# Patient Record
Sex: Male | Born: 1955 | Race: Black or African American | Hispanic: No | Marital: Married | Smoking: Never smoker
Health system: Southern US, Community
[De-identification: ages and names within clinical notes are randomized; demographics above are authoritative.]

## PROBLEM LIST (undated history)

## (undated) DIAGNOSIS — C801 Malignant (primary) neoplasm, unspecified: Secondary | ICD-10-CM

## (undated) DIAGNOSIS — R519 Headache, unspecified: Secondary | ICD-10-CM

## (undated) DIAGNOSIS — R51 Headache: Secondary | ICD-10-CM

## (undated) DIAGNOSIS — R011 Cardiac murmur, unspecified: Secondary | ICD-10-CM

## (undated) DIAGNOSIS — K4020 Bilateral inguinal hernia, without obstruction or gangrene, not specified as recurrent: Secondary | ICD-10-CM

## (undated) DIAGNOSIS — M1990 Unspecified osteoarthritis, unspecified site: Secondary | ICD-10-CM

## (undated) DIAGNOSIS — E785 Hyperlipidemia, unspecified: Secondary | ICD-10-CM

## (undated) DIAGNOSIS — I10 Essential (primary) hypertension: Secondary | ICD-10-CM

## (undated) DIAGNOSIS — R972 Elevated prostate specific antigen [PSA]: Secondary | ICD-10-CM

## (undated) HISTORY — DX: Essential (primary) hypertension: I10

## (undated) HISTORY — PX: HERNIA REPAIR: SHX51

## (undated) HISTORY — PX: PROSTATE BIOPSY: SHX241

## (undated) HISTORY — PX: COLONOSCOPY: SHX174

## (undated) HISTORY — DX: Headache: R51

## (undated) HISTORY — DX: Headache, unspecified: R51.9

## (undated) HISTORY — DX: Hyperlipidemia, unspecified: E78.5

---

## 2007-06-23 ENCOUNTER — Ambulatory Visit: Payer: Self-pay | Admitting: Unknown Physician Specialty

## 2007-07-08 ENCOUNTER — Ambulatory Visit: Payer: Self-pay | Admitting: Unknown Physician Specialty

## 2008-04-06 ENCOUNTER — Ambulatory Visit: Payer: Self-pay | Admitting: Oncology

## 2008-04-10 ENCOUNTER — Ambulatory Visit: Payer: Self-pay | Admitting: Oncology

## 2008-05-11 ENCOUNTER — Ambulatory Visit: Payer: Self-pay | Admitting: Oncology

## 2008-07-11 ENCOUNTER — Ambulatory Visit: Payer: Self-pay | Admitting: Oncology

## 2008-07-13 ENCOUNTER — Ambulatory Visit: Payer: Self-pay | Admitting: Oncology

## 2008-08-10 ENCOUNTER — Ambulatory Visit: Payer: Self-pay | Admitting: Oncology

## 2016-08-15 DIAGNOSIS — I10 Essential (primary) hypertension: Secondary | ICD-10-CM | POA: Insufficient documentation

## 2016-08-15 DIAGNOSIS — E782 Mixed hyperlipidemia: Secondary | ICD-10-CM | POA: Insufficient documentation

## 2016-12-04 ENCOUNTER — Encounter: Payer: Self-pay | Admitting: Urology

## 2016-12-04 ENCOUNTER — Ambulatory Visit: Payer: BC Managed Care – PPO | Admitting: Urology

## 2016-12-04 VITALS — BP 138/88 | HR 71 | Ht 71.0 in | Wt 199.6 lb

## 2016-12-04 DIAGNOSIS — R972 Elevated prostate specific antigen [PSA]: Secondary | ICD-10-CM

## 2016-12-04 LAB — URINALYSIS, COMPLETE
Bilirubin, UA: NEGATIVE
Glucose, UA: NEGATIVE
Ketones, UA: NEGATIVE
LEUKOCYTES UA: NEGATIVE
NITRITE UA: NEGATIVE
PH UA: 7 (ref 5.0–7.5)
PROTEIN UA: NEGATIVE
SPEC GRAV UA: 1.01 (ref 1.005–1.030)
Urobilinogen, Ur: 0.2 mg/dL (ref 0.2–1.0)

## 2016-12-04 NOTE — Progress Notes (Signed)
12/04/2016 10:00 AM   [name] Jul 19, 1956 [id number]  Referring provider: Danella Penton, MD [phone] [facility] [address] [facility] [place], [place] [address]  Chief Complaint  Patient presents with  . Elevated PSA    HPI: 61 year old [place] manis seen today for further evaluation and management of an elevated PSA.  The patient was found to have an elevated PSA as part of a routine screening by his primary care doctor. The patient denies any progression of his voiding symptoms over the past 6 months. In fact, he states that his voiding symptoms have improved over the past several years. He does complain of weak stream but feels that he empties his bladder completely. He gets up once at night. He does not have any significant trouble with erectile dysfunction. Denies any dysuria or gross hematuria. He has no history of recurrent urinary tract infections or prostatitis.  The patient has no family history of prostate cancer.  PSA history: 11/14/16: 5.90 08/06/16: 4.99 08/02/15: 3.78 07/28/14: 3.85     PMH: Past Medical History:  Diagnosis Date  . Headache   . Hyperlipidemia   . Hypertension     Surgical History: Past Surgical History:  Procedure Laterality Date  . HERNIA REPAIR      Home Medications:  Allergies as of 12/04/2016   No Known Allergies     Medication List       Accurate as of 12/04/16 10:00 AM. Always use your most recent med list.          aspirin EC 81 MG tablet Take 81 mg by mouth daily.   losartan 100 MG tablet Commonly known as:  COZAAR Take by mouth.   MULTI-VITAMINS Tabs Take by mouth.   simvastatin 40 MG tablet Commonly known as:  ZOCOR Take by mouth.   triamterene-hydrochlorothiazide 37.5-25 MG tablet Commonly known as:  MAXZIDE-25 Take by mouth.       Allergies: No Known Allergies  Family History: Family History  Problem Relation Age of Onset  . Bladder Cancer Neg Hx   . Kidney cancer Neg Hx   .  Prostate cancer Neg Hx     Social History:  reports that he has never smoked. He has never used smokeless tobacco. He reports that he drinks alcohol. He reports that he does not use drugs.  ROS: UROLOGY Frequent Urination?: Yes Hard to postpone urination?: No Burning/pain with urination?: No Get up at night to urinate?: Yes Leakage of urine?: No Urine stream starts and stops?: No Trouble starting stream?: No Do you have to strain to urinate?: No Blood in urine?: No Urinary tract infection?: No Sexually transmitted disease?: No Injury to kidneys or bladder?: No Painful intercourse?: No Weak stream?: Yes Erection problems?: No Penile pain?: No  Gastrointestinal Nausea?: No Vomiting?: No Indigestion/heartburn?: No Diarrhea?: No Constipation?: No  Constitutional Fever: No Night sweats?: No Weight loss?: No Fatigue?: No  Skin Skin rash/lesions?: No Itching?: No  Eyes Blurred vision?: No Double vision?: No  Ears/Nose/Throat Sore throat?: No Sinus problems?: No  Hematologic/Lymphatic Swollen glands?: No Easy bruising?: No  Cardiovascular Leg swelling?: No Chest pain?: No  Respiratory Cough?: No Shortness of breath?: No  Endocrine Excessive thirst?: No  Musculoskeletal Back pain?: No Joint pain?: No  Neurological Headaches?: No Dizziness?: No  Psychologic Depression?: No Anxiety?: No  Physical Exam: BP 138/88 (BP Location: Left Arm, Patient Position: Sitting, Cuff Size: Normal)   Pulse 71   Ht 5\' 11"  (1.803 m)   Wt 90.5  kg (199 lb 9.6 oz)   BMI 27.84 kg/m   Constitutional:  Alert and oriented, No acute distress. HEENT: [place] AT, moist mucus membranes.  Trachea midline, no masses. Cardiovascular: No clubbing, cyanosis, or edema. Respiratory: Normal respiratory effort, no increased work of breathing. GI: Abdomen is soft, nontender, nondistended, no abdominal masses GU: No CVA tenderness. DRE: Smooth, symmetric, + 3 Skin: No rashes, bruises  or suspicious lesions. Lymph: No cervical or inguinal adenopathy. Neurologic: Grossly intact, no focal deficits, moving all 4 extremities. Psychiatric: Normal mood and affect.  Laboratory Data: No results found for: WBC, HGB, HCT, MCV, PLT  No results found for: CREATININE  No results found for: PSA  No results found for: TESTOSTERONE  No results found for: HGBA1C  Urinalysis No results found for: COLORURINE, APPEARANCEUR, LABSPEC, PHURINE, GLUCOSEU, HGBUR, BILIRUBINUR, KETONESUR, PROTEINUR, UROBILINOGEN, NITRITE, LEUKOCYTESUR  Pertinent Imaging: none  Assessment & Plan:  The patient has an elevated PSA, he has otherwise no significant risk factors. However, he does not have any voiding symptoms or evidence of infection. His UA today demonstrates a healthy urinary tract without hematuria or inflammation.  1. Elevated PSA I spoke to the patient in detail about an elevated PSA in the differential diagnosis associated with it. We also discussed the pitfalls of PSA screening and that it is highly sensitive but nonspecific for prostate cancer. I also went over his risk factors for prostate cancer and told him that his PSAtrend it was concerning. However, I'll subsequently on the typically prostate cancer is a slowly progressive cancer in most men and that if we wanted to establish a more accurate and longer trend it would be reasonable to repeat his PSA in 3 months with close follow-up. We also briefly discussed prostate biopsy and I explained to him that this was the way to truly diagnose a prostate cancer. We also discussed the risks of biopsy including infection requiring additional antibiotics or hospitalization. After our discussion the patient and his wife have opted to proceed with repeat PSA in 3 months followed by an office visit.  - Urinalysis, Complete - PSA, total and free; Future   Return in about 3 months (around 03/06/2017) for w/ PSA prior.  Crist Fat,  MD  [facility] [address], [address] [place], [place] [address] [phone]

## 2017-03-15 ENCOUNTER — Other Ambulatory Visit: Payer: BC Managed Care – PPO

## 2017-03-15 DIAGNOSIS — R972 Elevated prostate specific antigen [PSA]: Secondary | ICD-10-CM

## 2017-03-16 LAB — PSA, TOTAL AND FREE
PSA FREE: 1.09 ng/mL
PSA, Free Pct: 20.6 %
Prostate Specific Ag, Serum: 5.3 ng/mL — ABNORMAL HIGH (ref 0.0–4.0)

## 2017-03-18 ENCOUNTER — Ambulatory Visit: Payer: BC Managed Care – PPO | Admitting: Urology

## 2017-03-18 ENCOUNTER — Encounter: Payer: Self-pay | Admitting: Urology

## 2017-03-18 VITALS — BP 148/97 | HR 82 | Ht 71.0 in | Wt 192.0 lb

## 2017-03-18 DIAGNOSIS — R972 Elevated prostate specific antigen [PSA]: Secondary | ICD-10-CM

## 2017-03-18 DIAGNOSIS — N400 Enlarged prostate without lower urinary tract symptoms: Secondary | ICD-10-CM | POA: Insufficient documentation

## 2017-03-18 NOTE — Progress Notes (Signed)
03/18/2017 11:17 AM   Glenn Burns [birth year] [id number]  Referring provider: Danella Penton, MD [phone] [facility] [address] [facility] [place], [place] [address]  Chief Complaint  Patient presents with  . Elevated PSA    82month    HPI: 61 year old Glenn Burns manis seen today for further evaluation and management of an elevated PSA.  The patient was found to have an elevated PSA as part of a routine screening by his primary care doctor. The patient denies any progression of his voiding symptoms over the past 6 months. In fact, he states that his voiding symptoms have improved over the past several years. He does complain of weak stream but feels that he empties his bladder completely. He gets up once at night. He does not have any significant trouble with erectile dysfunction. Denies any dysuria or gross hematuria. He has no history of recurrent urinary tract infections or prostatitis.  The patient has no family history of prostate cancer.  PSA history: 03/15/17: 5.3 (20% free) 11/14/16: 5.90 08/06/16: 4.99 08/02/15: 3.78 07/28/14: 3.85    Interval: The patient presents today for follow-up of elevated PSA. He denies any progression of his urinary tract symptoms. He denies any gross hematuria dysuria. He denies any new bone or back pain.  PMH: Past Medical History:  Diagnosis Date  . Headache   . Hyperlipidemia   . Hypertension     Surgical History: Past Surgical History:  Procedure Laterality Date  . HERNIA REPAIR      Home Medications:  Allergies as of 03/18/2017   No Known Allergies     Medication List       Accurate as of 03/18/17 11:17 AM. Always use your most recent med list.          aspirin EC 81 MG tablet Take 81 mg by mouth daily.   losartan 100 MG tablet Commonly known as:  COZAAR Take by mouth.   MULTI-VITAMINS Tabs Take by mouth.   simvastatin 40 MG tablet Commonly known as:  ZOCOR Take by mouth.     triamterene-hydrochlorothiazide 37.5-25 MG tablet Commonly known as:  MAXZIDE-25 Take by mouth.       Allergies: No Known Allergies  Family History: Family History  Problem Relation Age of Onset  . Bladder Cancer Neg Hx   . Kidney cancer Neg Hx   . Prostate cancer Neg Hx     Social History:  reports that he has never smoked. He has never used smokeless tobacco. He reports that he drinks alcohol. He reports that he does not use drugs.  ROS: UROLOGY Frequent Urination?: No Hard to postpone urination?: No Burning/pain with urination?: No Get up at night to urinate?: Yes Leakage of urine?: No Urine stream starts and stops?: No Trouble starting stream?: No Do you have to strain to urinate?: No Blood in urine?: No Urinary tract infection?: No Sexually transmitted disease?: No Injury to kidneys or bladder?: No Painful intercourse?: No Weak stream?: Yes Erection problems?: No Penile pain?: No  Gastrointestinal Nausea?: No Vomiting?: No Indigestion/heartburn?: No Diarrhea?: No Constipation?: No  Constitutional Fever: No Night sweats?: No Weight loss?: No Fatigue?: No  Skin Skin rash/lesions?: No Itching?: No  Eyes Blurred vision?: No Double vision?: No  Ears/Nose/Throat Sore throat?: No Sinus problems?: Yes  Hematologic/Lymphatic Swollen glands?: No Easy bruising?: No  Cardiovascular Leg swelling?: No Chest pain?: No  Respiratory Cough?: No Shortness of breath?: No  Endocrine Excessive thirst?: No  Musculoskeletal Back pain?: No Joint pain?: No  Neurological Headaches?: No Dizziness?: No  Psychologic Depression?: No Anxiety?: No  Physical Exam: BP (!) 148/97   Pulse 82   Ht 5\' 11"  (1.803 m)   Wt 87.1 kg (192 lb)   BMI 26.78 kg/m   Constitutional:  Alert and oriented, No acute distress. HEENT:  AT, moist mucus membranes.  Trachea midline, no masses. DRE: 3/18: Smooth, symmetric, + 3 Neurologic: Grossly intact, no focal  deficits, moving all 4 extremities. Psychiatric: Normal mood and affect.  Laboratory Data: No results found for: WBC, HGB, HCT, MCV, PLT  No results found for: CREATININE  No results found for: PSA  No results found for: TESTOSTERONE  No results found for: HGBA1C  Urinalysis    Component Value Date/Time   APPEARANCEUR Clear 12/04/2016 0915   GLUCOSEU Negative 12/04/2016 0915   BILIRUBINUR Negative 12/04/2016 0915   PROTEINUR Negative 12/04/2016 0915   NITRITE Negative 12/04/2016 0915   LEUKOCYTESUR Negative 12/04/2016 0915    Pertinent Imaging: none  Assessment & Plan:  The patient's PSA remains elevated, although it has decreased from 3 months prior. I spoke to the patient about the significance of an elevated PSA, albeit his is going down. This is somewhat reassuring. However, it still does remain elevated, and should be continued to be followed and/or evaluated further.  1. Elevated PSA Our discussion today centered around repeating a PSA in 6 months or performing a prostate biopsy. I told him that since his PSA went down, the likelihood of him having an aggressive prostate cancer at this point is fairly low and that repeating a PSA in 6 months would not preclude curative therapy in the future. As such, we have opted to continue surveillance and to repeat a PSA in 6 months.   - Urinalysis, Complete - PSA, total and free; Future   No Follow-up on file.  Crist Fat, MD  [facility] [address], [address] [place], [place] [address] [phone]

## 2017-09-16 ENCOUNTER — Other Ambulatory Visit: Payer: Self-pay

## 2017-09-16 DIAGNOSIS — R972 Elevated prostate specific antigen [PSA]: Secondary | ICD-10-CM

## 2017-09-17 ENCOUNTER — Other Ambulatory Visit: Payer: BC Managed Care – PPO

## 2017-09-17 ENCOUNTER — Encounter: Payer: Self-pay | Admitting: Urology

## 2017-09-19 ENCOUNTER — Ambulatory Visit: Payer: BC Managed Care – PPO

## 2019-11-05 ENCOUNTER — Ambulatory Visit: Payer: Self-pay | Attending: Family

## 2019-11-05 DIAGNOSIS — Z23 Encounter for immunization: Secondary | ICD-10-CM | POA: Insufficient documentation

## 2019-11-05 NOTE — Progress Notes (Signed)
   Covid-19 Vaccination Clinic  Name:  Jaloni Davoli    MRN: [id number] DOB: [birth year]  11/05/2019  Mr. Mesina was observed post Covid-19 immunization for 15 minutes without incidence. He was provided with Vaccine Information Sheet and instruction to access the V-Safe system.   Mr. Holt was instructed to call 911 with any severe reactions post vaccine: Marland Kitchen Difficulty breathing  . Swelling of your face and throat  . A fast heartbeat  . A bad rash all over your body  . Dizziness and weakness    Immunizations Administered    Name Date Dose VIS Date Route   Moderna COVID-19 Vaccine 11/05/2019  2:13 PM 0.5 mL 08/11/2019 Intramuscular   Manufacturer: Moderna   Lot: [id number]   NDC: 01642-903-79

## 2019-12-08 ENCOUNTER — Ambulatory Visit: Payer: Self-pay | Attending: Family

## 2019-12-08 DIAGNOSIS — Z23 Encounter for immunization: Secondary | ICD-10-CM

## 2019-12-08 NOTE — Progress Notes (Signed)
   Covid-19 Vaccination Clinic  Name:  [name]    MRN: [id number] DOB: 03-17-1956  12/08/2019  [name] was observed post Covid-19 immunization for 15 minutes without incident. He was provided with Vaccine Information Sheet and instruction to access the V-Safe system.   [name] was instructed to call 911 with any severe reactions post vaccine: Marland Kitchen Difficulty breathing  . Swelling of face and throat  . A fast heartbeat  . A bad rash all over body  . Dizziness and weakness   Immunizations Administered    Name Date Dose VIS Date Route   Moderna COVID-19 Vaccine 12/08/2019 10:20 AM 0.5 mL 08/11/2019 Intramuscular   Manufacturer: Moderna   Lot: [id number]   NDC: 92493-241-99

## 2020-07-13 ENCOUNTER — Ambulatory Visit: Payer: Self-pay | Attending: Family

## 2020-07-13 DIAGNOSIS — Z23 Encounter for immunization: Secondary | ICD-10-CM

## 2020-09-19 ENCOUNTER — Emergency Department: Payer: BC Managed Care – PPO

## 2020-09-19 ENCOUNTER — Other Ambulatory Visit: Payer: Self-pay

## 2020-09-19 DIAGNOSIS — Z79899 Other long term (current) drug therapy: Secondary | ICD-10-CM | POA: Insufficient documentation

## 2020-09-19 DIAGNOSIS — Z8673 Personal history of transient ischemic attack (TIA), and cerebral infarction without residual deficits: Secondary | ICD-10-CM | POA: Insufficient documentation

## 2020-09-19 DIAGNOSIS — Z20822 Contact with and (suspected) exposure to covid-19: Secondary | ICD-10-CM | POA: Diagnosis not present

## 2020-09-19 DIAGNOSIS — Z7982 Long term (current) use of aspirin: Secondary | ICD-10-CM | POA: Diagnosis not present

## 2020-09-19 DIAGNOSIS — I10 Essential (primary) hypertension: Secondary | ICD-10-CM | POA: Diagnosis not present

## 2020-09-19 DIAGNOSIS — H532 Diplopia: Secondary | ICD-10-CM | POA: Insufficient documentation

## 2020-09-19 DIAGNOSIS — R42 Dizziness and giddiness: Secondary | ICD-10-CM | POA: Diagnosis present

## 2020-09-19 LAB — COMPREHENSIVE METABOLIC PANEL
ALT: 17 U/L (ref 0–44)
AST: 21 U/L (ref 15–41)
Albumin: 4.4 g/dL (ref 3.5–5.0)
Alkaline Phosphatase: 46 U/L (ref 38–126)
Anion gap: 11 (ref 5–15)
BUN: 21 mg/dL (ref 8–23)
CO2: 29 mmol/L (ref 22–32)
Calcium: 9.4 mg/dL (ref 8.9–10.3)
Chloride: 100 mmol/L (ref 98–111)
Creatinine, Ser: 1.01 mg/dL (ref 0.61–1.24)
GFR, Estimated: >60 mL/min (ref >60)
Glucose, Bld: 107 mg/dL — ABNORMAL HIGH (ref 70–99)
Potassium: 3.7 mmol/L (ref 3.5–5.1)
Sodium: 140 mmol/L (ref 135–145)
Total Bilirubin: 0.7 mg/dL (ref 0.3–1.2)
Total Protein: 7.6 g/dL (ref 6.5–8.1)

## 2020-09-19 LAB — CBC
HCT: 47.5 % (ref 39.0–52.0)
Hemoglobin: 15.2 g/dL (ref 13.0–17.0)
MCH: 26.8 pg (ref 26.0–34.0)
MCHC: 32 g/dL (ref 30.0–36.0)
MCV: 83.8 fL (ref 80.0–100.0)
Platelets: 216 10*3/uL (ref 150–400)
RBC: 5.67 MIL/uL (ref 4.22–5.81)
RDW: 13.5 % (ref 11.5–15.5)
WBC: 5.2 10*3/uL (ref 4.0–10.5)
nRBC: 0 % (ref 0.0–0.2)

## 2020-09-19 LAB — TROPONIN I (HIGH SENSITIVITY)
Troponin I (High Sensitivity): 5 ng/L (ref <18)
Troponin I (High Sensitivity): 5 ng/L (ref <18)

## 2020-09-19 NOTE — ED Triage Notes (Signed)
Pt states today at 1630 he had episode of double vision and dizziness that lasted about 1-2 mins. States symptoms have resolved now just has nausea.

## 2020-09-19 NOTE — ED Notes (Signed)
No code stroke per Dr. Derrill Kay

## 2020-09-19 NOTE — ED Triage Notes (Signed)
First Nurse Note:  Arrives from [facility] for ED evaluation of sudden onset blurred vision, which is now resolved, and HTN.  Patient is AAOx3.  Skin warm and dry no apparent distress

## 2020-09-19 NOTE — ED Notes (Signed)
Pt also complaining of right sided back/rib pain. Started last Saturday. When pt twist to move he says he feels a dull pain in that area. He forgot to mention in earlier triage note.

## 2020-09-20 ENCOUNTER — Emergency Department: Payer: BC Managed Care – PPO

## 2020-09-20 ENCOUNTER — Encounter: Payer: Self-pay | Admitting: Radiology

## 2020-09-20 ENCOUNTER — Emergency Department
Admission: EM | Admit: 2020-09-20 | Discharge: 2020-09-20 | Disposition: A | Discharge status: Home / Self Care | Payer: BC Managed Care – PPO | Attending: Emergency Medicine | Admitting: Emergency Medicine

## 2020-09-20 DIAGNOSIS — R42 Dizziness and giddiness: Secondary | ICD-10-CM

## 2020-09-20 DIAGNOSIS — H532 Diplopia: Secondary | ICD-10-CM

## 2020-09-20 DIAGNOSIS — I10 Essential (primary) hypertension: Secondary | ICD-10-CM

## 2020-09-20 LAB — RESP PANEL BY RT-PCR (FLU A&B, COVID) ARPGX2
Influenza A by PCR: NEGATIVE
Influenza B by PCR: NEGATIVE
SARS Coronavirus 2 by RT PCR: NEGATIVE

## 2020-09-20 LAB — D-DIMER, QUANTITATIVE: D-Dimer, Quant: <0.27 ug/mL-FEU (ref 0.00–0.50)

## 2020-09-20 MED ORDER — CLONIDINE HCL 0.1 MG PO TABS: 0.1000 mg | ORAL_TABLET | Freq: Two times a day (BID) | ORAL | 0 refills | Status: DC | PRN

## 2020-09-20 MED ORDER — IOHEXOL 350 MG/ML SOLN
75.0000 mL | Freq: Once | INTRAVENOUS | Status: AC | PRN
  Administered 2020-09-20: 75 mL via INTRAVENOUS

## 2020-09-20 MED ORDER — CLONIDINE HCL 0.1 MG PO TABS
0.1000 mg | ORAL_TABLET | Freq: Once | ORAL | Status: AC
  Administered 2020-09-20: 0.1 mg via ORAL
  Filled 2020-09-20: qty 1

## 2020-09-20 MED ORDER — MECLIZINE HCL 25 MG PO TABS: 25.0000 mg | ORAL_TABLET | Freq: Three times a day (TID) | ORAL | 0 refills | Status: DC | PRN

## 2020-09-20 NOTE — ED Notes (Signed)
Provider to bedside to update patient

## 2020-09-20 NOTE — Discharge Instructions (Addendum)
1.  You may take Clonidine up to twice daily for: Top blood pressure>200 Bottom blood pressure>100 2.  Continue to take your other blood pressure medications until you see your doctor 3.  Return to the ER for worsening symptoms, persistent vomiting, difficulty breathing or other concerns.

## 2020-09-20 NOTE — ED Provider Notes (Signed)
[facility] Emergency Department Provider Note   ____________________________________________   Event Date/Time   First MD Initiated Contact with Patient 09/20/20 0226     (approximate)  I have reviewed the triage vital signs and the nursing notes.   HISTORY  Chief Complaint Visual Field Change    HPI Glenn Burns is a 65 y.o. male sent to the ED from [facility] for evaluation of TIA.  Patient reports since September he has had multiple episodes of diplopia associated with dizziness, lasting 1 to 2 minutes.  He has been unable to make an appointment to see his eye doctor secondary to long wait.  Saw his PCP during that time who thought it may be related to elevated blood pressure.  Yesterday approximately 4:30 PM he felt double vision and dizziness coming on while [place]  He was able to pull over and experienced 1 to 2 minutes of double vision with dizziness which he describes as his car spinning.  Associated with nausea only.  Denies associated headache, neck pain, fever, cough, chest pain, shortness of breath, abdominal pain, vomiting, slurred speech, facial droop, extremity weakness/numbness/tingling.  Patient is vaccinated against COVID-19. Patient presented to [facility] for evaluation and was sent over for further imaging.  Also notes several days of right thoracic back pain worsened by movement, not affected by deep inspiration.  Feels like a pulled muscle.  Denies recent trauma, travel or hormone use.     Past Medical History:  Diagnosis Date  . Headache   . Hyperlipidemia   . Hypertension     Patient Active Problem List   Diagnosis Date Noted  . BPH (benign prostatic hyperplasia) 03/18/2017  . Benign essential hypertension 08/15/2016  . Hyperlipidemia, mixed 08/15/2016    Past Surgical History:  Procedure Laterality Date  . HERNIA REPAIR      Prior to Admission medications   Medication Sig Start  Date End Date Taking? Authorizing Provider  cloNIDine (CATAPRES) 0.1 MG tablet Take 1 tablet (0.1 mg total) by mouth 2 (two) times daily as needed. 09/20/20  Yes Irean Hong, MD  meclizine (ANTIVERT) 25 MG tablet Take 1 tablet (25 mg total) by mouth 3 (three) times daily as needed for dizziness or nausea. 09/20/20  Yes Irean Hong, MD  aspirin EC 81 MG tablet Take 81 mg by mouth daily.    [provider]  losartan (COZAAR) 100 MG tablet Take by mouth. 08/15/16   [provider]  Multiple Vitamin (MULTI-VITAMINS) TABS Take by mouth.    [provider]  simvastatin (ZOCOR) 40 MG tablet Take by mouth. 08/15/16   [provider]  triamterene-hydrochlorothiazide (MAXZIDE-25) 37.5-25 MG tablet Take by mouth. 08/15/16   [provider]    Allergies Patient has no known allergies.  Family History  Problem Relation Age of Onset  . Bladder Cancer Neg Hx   . Kidney cancer Neg Hx   . Prostate cancer Neg Hx     Social History Social History   Tobacco Use  . Smoking status: Never Smoker  . Smokeless tobacco: Never Used  Substance Use Topics  . Alcohol use: Yes  . Drug use: No    Review of Systems  Constitutional: No fever/chills Eyes: Positive for double vision.  No visual changes. ENT: No sore throat. Cardiovascular: Denies chest pain. Respiratory: Denies shortness of breath. Gastrointestinal: No abdominal pain.  No nausea, no vomiting.  No diarrhea.  No constipation. Genitourinary: Negative  for dysuria. Musculoskeletal: Negative for back pain. Skin: Negative for rash. Neurological: Positive for dizziness. Negative for headaches, focal weakness or numbness.   ____________________________________________   PHYSICAL EXAM:  VITAL SIGNS: ED Triage Vitals [09/19/20 1904]  Enc Vitals Group     BP (!) 181/123     Pulse Rate 66     Resp 20     Temp 98 F (36.7 C)     Temp Source Oral     SpO2 97 %     Weight 195 lb (88.5 kg)     Height  5\' 11"  (1.803 m)     Head Circumference      Peak Flow      Pain Score 0     Pain Loc      Pain Edu?      Excl. in GC?     Constitutional: Alert and oriented. Well appearing and in no acute distress. Eyes: Conjunctivae are normal. PERRL. EOMI. No diplopia currently. Head: Atraumatic. Nose: No congestion/rhinnorhea. Mouth/Throat: Mucous membranes are moist.   Neck: No stridor.  No carotid bruits.  Supple neck without meningismus. Cardiovascular: Normal rate, regular rhythm. Grossly normal heart sounds.  Good peripheral circulation. Respiratory: Normal respiratory effort.  No retractions. Lungs CTAB. Gastrointestinal: Soft and nontender. No distention. No abdominal bruits. No CVA tenderness. Musculoskeletal: No lower extremity tenderness nor edema.  No joint effusions. Neurologic: Alert and oriented x3.  CN II to XII grossly intact.  Normal speech and language. No gross focal neurologic deficits are appreciated. MAEx4. No gait instability. Skin:  Skin is warm, dry and intact. No rash noted. Psychiatric: Mood and affect are normal. Speech and behavior are normal.  ____________________________________________   LABS (all labs ordered are listed, but only abnormal results are displayed)  Labs Reviewed  COMPREHENSIVE METABOLIC PANEL - Abnormal; Notable for the following components:      Result Value   Glucose, Bld 107 (*)    All other components within normal limits  RESP PANEL BY RT-PCR (FLU A&B, COVID) ARPGX2  CBC  D-DIMER, QUANTITATIVE (NOT AT [facility])  TROPONIN I (HIGH SENSITIVITY)  TROPONIN I (HIGH SENSITIVITY)   ____________________________________________  EKG  ED ECG REPORT I, Kendryck Lacroix J, the attending physician, personally viewed and interpreted this ECG.   Date: 09/20/2020  EKG Time: 1926  Rate: 63  Rhythm: normal EKG, normal sinus rhythm  Axis: Normal  Intervals:none  ST&T Change: Nonspecific  ____________________________________________  RADIOLOGY I,  Maverik Foot J, personally viewed and evaluated these images (plain radiographs) as part of my medical decision making, as well as reviewing the written report by the radiologist.  ED MD interpretation: No ICH  Official radiology report(s): DG Chest 2 View  Result Date: 09/20/2020 CLINICAL DATA:  Right rib and back pain which began last Saturday, dull pain with twisting motion EXAM: CHEST - 2 VIEW COMPARISON:  None. FINDINGS: Some streaky atelectatic changes are present likely related to splinting. No consolidation, features of edema, pneumothorax, or effusion. The cardiomediastinal contours are unremarkable. No visible displaced rib fracture or acute traumatic osseous injuries of the chest wall. Soft tissues are unremarkable. Telemetry leads overlie the chest. IMPRESSION: Streaky atelectatic changes likely related to splinting in the setting of chest pain. No radiographically evident chest wall abnormality or other acute cardiopulmonary findings. Electronically Signed   By: Monday M.D.   On: 09/20/2020 02:56   CT Head Wo Contrast  Result Date: 09/19/2020 CLINICAL DATA:  Neuro deficit, acute, stroke suspected Dizziness and double lesion earlier  today. EXAM: CT HEAD WITHOUT CONTRAST TECHNIQUE: Contiguous axial images were obtained from the base of the skull through the vertex without intravenous contrast. COMPARISON:  None. FINDINGS: Brain: Brain volume is normal for age. No intracranial hemorrhage, mass effect, or midline shift. No hydrocephalus. The basilar cisterns are patent. No evidence of territorial infarct or acute ischemia. No extra-axial or intracranial fluid collection. Vascular: No hyperdense vessel or unexpected calcification. Skull: No fracture or focal lesion. Sinuses/Orbits: Paranasal sinuses and mastoid air cells are clear. The visualized orbits are unremarkable. Other: None. IMPRESSION: Negative noncontrast head CT. Electronically Signed   By: Narda Rutherford M.D.   On: 09/19/2020  19:28   CT Angio Chest PE W/Cm &/Or Wo Cm  Result Date: 09/20/2020 CLINICAL DATA:  65 year old male with chest pain, shortness of breath, recurrent syncope. EXAM: CT ANGIOGRAPHY CHEST WITH CONTRAST TECHNIQUE: Multidetector CT imaging of the chest was performed using the standard protocol during bolus administration of intravenous contrast. Multiplanar CT image reconstructions and MIPs were obtained to evaluate the vascular anatomy. CONTRAST:  63mL OMNIPAQUE IOHEXOL 350 MG/ML SOLN COMPARISON:  Chest radiographs 0242 hours today. CT Abdomen and Pelvis 07/08/2007. FINDINGS: Cardiovascular: Adequate contrast bolus timing in the pulmonary arterial tree. No focal filling defect identified in the pulmonary arteries to suggest acute pulmonary embolism. Some calcified coronary artery atherosclerosis is evident on series 5, image 136. No cardiomegaly or pericardial effusion. Negative visible aorta. Mediastinum/Nodes: Negative. Lungs/Pleura: Major airways are patent. There is mild dependent atelectasis in both lungs. No pleural effusion or other abnormal pulmonary opacity. Upper Abdomen: Negative visible liver, gallbladder, spleen, pancreas, right adrenal gland, and bowel in the upper abdomen. Musculoskeletal: No acute osseous abnormality identified. Review of the MIP images confirms the above findings. IMPRESSION: 1. Negative for acute pulmonary embolus. 2. Mild pulmonary atelectasis. 3. Calcified coronary artery atherosclerosis. Electronically Signed   By: Odessa Fleming M.D.   On: 09/20/2020 05:26   MR BRAIN WO CONTRAST  Result Date: 09/20/2020 CLINICAL DATA:  Dizziness and diplopia EXAM: MRI HEAD WITHOUT CONTRAST TECHNIQUE: Multiplanar, multiecho pulse sequences of the brain and surrounding structures were obtained without intravenous contrast. COMPARISON:  None. FINDINGS: Brain: No acute infarct, mass effect or extra-axial collection. No acute or chronic hemorrhage. There is minimal multifocal hyperintense T2-weighted  signal within the white matter. Parenchymal volume and CSF spaces are normal. The midline structures are normal. Vascular: Major flow voids are preserved. Skull and upper cervical spine: Normal calvarium and skull base. Visualized upper cervical spine and soft tissues are normal. Sinuses/Orbits:Right maxillary sinus opacification.  Normal orbits. IMPRESSION: Normal brain MRI. Electronically Signed   By: Deatra Robinson M.D.   On: 09/20/2020 03:36    ____________________________________________   PROCEDURES  Procedure(s) performed (including Critical Care):  Procedures   ____________________________________________   INITIAL IMPRESSION / ASSESSMENT AND PLAN / ED COURSE  As part of my medical decision making, I reviewed the following data within the electronic MEDICAL RECORD NUMBER Nursing notes reviewed and incorporated, Labs reviewed, EKG interpreted, Old chart reviewed, Radiograph reviewed and Notes from prior ED visits     65 year old male presenting with several episodes of diplopia associated with dizziness/vertigo x4 to 5 months.  Differential diagnosis includes but is not limited to CVA, TIA, ACS, metabolic, infectious etiologies, etc.  CT head, cardiac panel unremarkable.  Will obtain MRI of the brain, check chest x-ray, D-dimer, respiratory panel.  Will reassess.  Clinical Course as of 09/20/20 0658  Tue Sep 20, 2020  0402 Patient resting in no  acute distress.  Updated him on MRI result.  Unfortunately, D-dimer is a send out currently.  Will obtain CTA chest to evaluate for PE.  Personally reviewed patient's old chart and see he had a office visit 07/2020 where his PCP changed his antihypertensive from losartan to olmesartan 40 mg.  He continued Maxide 37.5/25 mg [JS]  0540 Updated patient on negative CTA chest results.  Respiratory panel also negative.  Will refer patient to ophthalmology and neurology for outpatient follow-up.  He will follow-up with his PCP to address blood pressure.   Will prescribe limited quantity clonidine to bridge him until his appointment.  Strict return precautions given.  Patient verbalizes understanding and agrees with plan of care [JS]    Clinical Course User Index [JS] Irean Hong, MD     ____________________________________________   FINAL CLINICAL IMPRESSION(S) / ED DIAGNOSES  Final diagnoses:  Primary hypertension  Dizziness  Diplopia     ED Discharge Orders         Ordered    cloNIDine (CATAPRES) 0.1 MG tablet  2 times daily PRN        09/20/20 0541    meclizine (ANTIVERT) 25 MG tablet  3 times daily PRN        09/20/20 0556          *Please note:  Glenn Burns was evaluated in Emergency Department on 09/20/2020 for the symptoms described in the history of present illness. He was evaluated in the context of the global COVID-19 pandemic, which necessitated consideration that the patient might be at risk for infection with the SARS-CoV-2 virus that causes COVID-19. Institutional protocols and algorithms that pertain to the evaluation of patients at risk for COVID-19 are in a state of rapid change based on information released by regulatory bodies including the CDC and federal and state organizations. These policies and algorithms were followed during the patient's care in the ED.  Some ED evaluations and interventions may be delayed as a result of limited staffing during and the pandemic.*   Note:  This document was prepared using Dragon voice recognition software and may include unintentional dictation errors.   Irean Hong, MD 09/20/20 [phone]

## 2020-09-20 NOTE — ED Notes (Signed)
Pt transported to MRI 

## 2020-09-24 NOTE — Progress Notes (Signed)
   Covid-19 Vaccination Clinic  Name:  Trinton Prewitt    MRN: [id number] DOB: [birth year]  09/24/2020  Mr. Osias was observed post Covid-19 immunization for 15 minutes without incident. He was provided with Vaccine Information Sheet and instruction to access the V-Safe system.   Mr. Mcadory was instructed to call 911 with any severe reactions post vaccine: Marland Kitchen Difficulty breathing  . Swelling of face and throat  . A fast heartbeat  . A bad rash all over body  . Dizziness and weakness   Immunizations Administered    Name Date Dose VIS Date Route   Moderna COVID-19 Vaccine 07/13/2020 -- -- --   Lot: [id number]   Moderna Covid-19 Booster Vaccine 07/13/2020 10:40 AM 0.25 mL 06/29/2020 Intramuscular   Manufacturer: Moderna   Lot: [id number]   NDC: 76151-834-37

## 2021-06-27 ENCOUNTER — Ambulatory Visit: Payer: BC Managed Care – PPO | Attending: Family

## 2021-06-27 DIAGNOSIS — Z23 Encounter for immunization: Secondary | ICD-10-CM

## 2021-06-27 NOTE — Progress Notes (Signed)
   Covid-19 Vaccination Clinic  Name:  Rik Wadel    MRN: [id number] DOB: 1956-05-15  06/27/2021  Mr. Granato was observed post Covid-19 immunization for 15 minutes without incident. He was provided with Vaccine Information Sheet and instruction to access the V-Safe system.   Mr. Lepage was instructed to call 911 with any severe reactions post vaccine: Difficulty breathing  Swelling of face and throat  A fast heartbeat  A bad rash all over body  Dizziness and weakness

## 2022-05-28 IMAGING — CT CT ANGIO CHEST
2 of 6 series · 19 of 46 positions shown · IV contrast (omnipaque)
Comparison: Chest radiographs 1939 hours today. CT Abdomen and
Pelvis 07/08/2007.

CLINICAL DATA: 64-year-old male with chest pain, shortness of
breath, recurrent syncope.

EXAM:
CT ANGIOGRAPHY CHEST WITH CONTRAST
TECHNIQUE: Multidetector CT imaging of the chest was performed using the
standard protocol during bolus administration of intravenous
contrast. Multiplanar CT image reconstructions and MIPs were
obtained to evaluate the vascular anatomy.
CONTRAST:  75mL OMNIPAQUE IOHEXOL 350 MG/ML SOLN

[Series 5: thins · axial · 0.68mm/px · z∈[-541,-286]mm · 17 of 281 slices shown]
[im 13/281  lung]
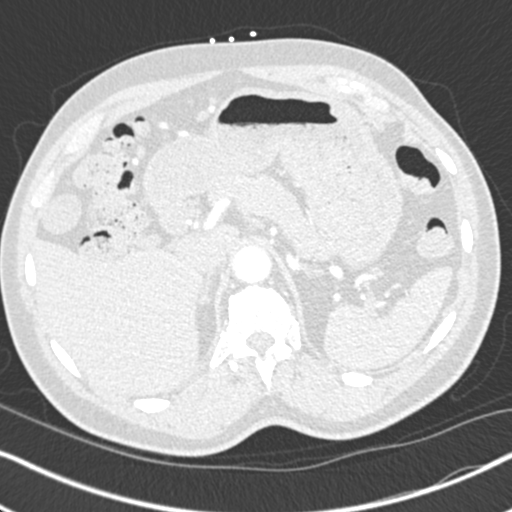
[im 25/281  soft-tissue]
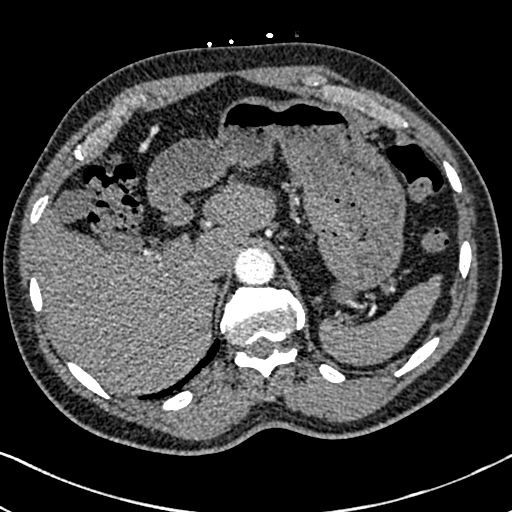
[im 49/281  lung]
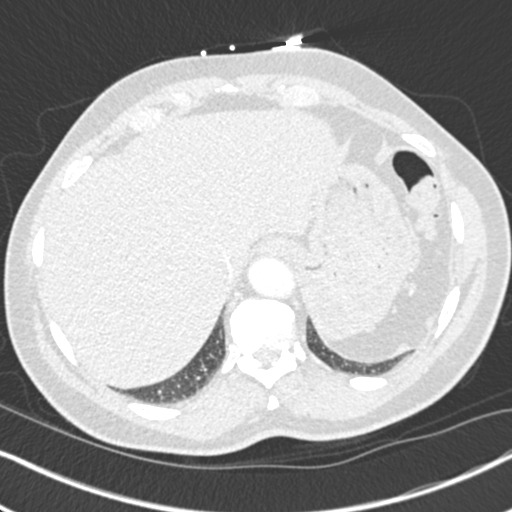
[im 61/281  soft-tissue]
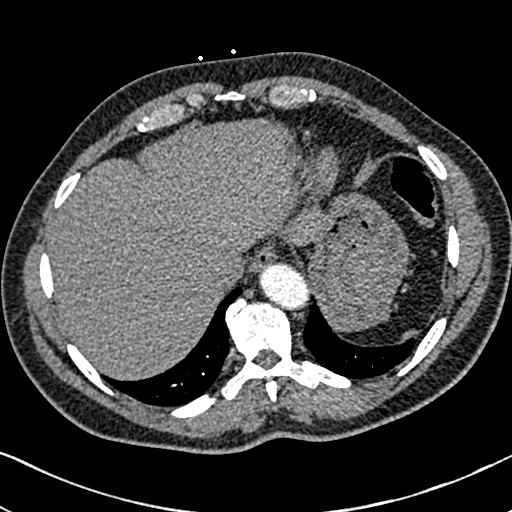
[im 74/281  lung]
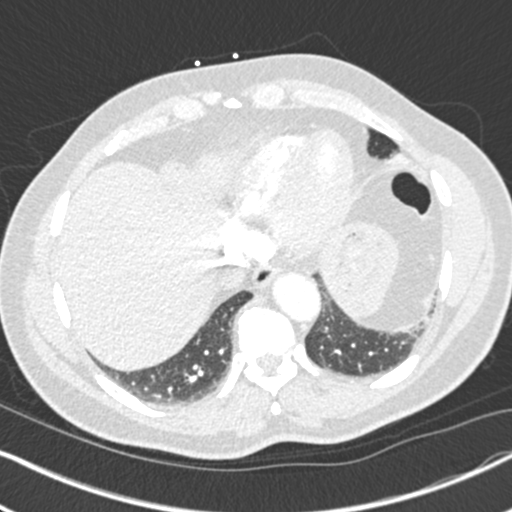
[im 98/281  soft-tissue]
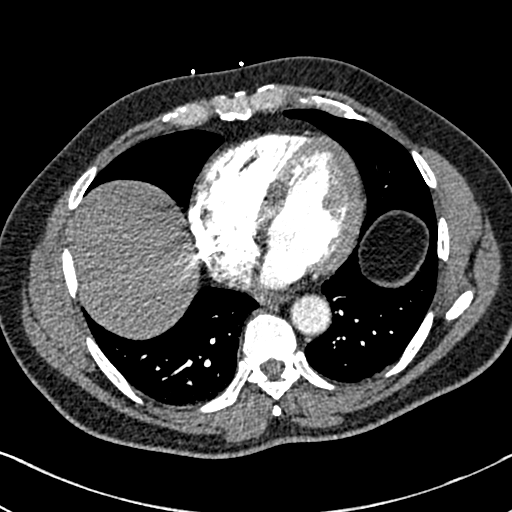
[im 110/281  lung]
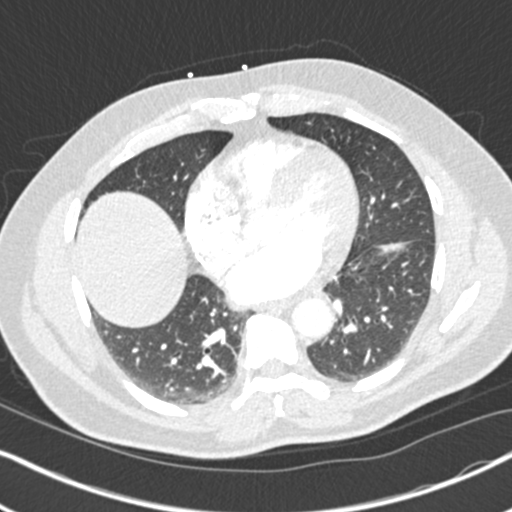
[im 122/281  soft-tissue]
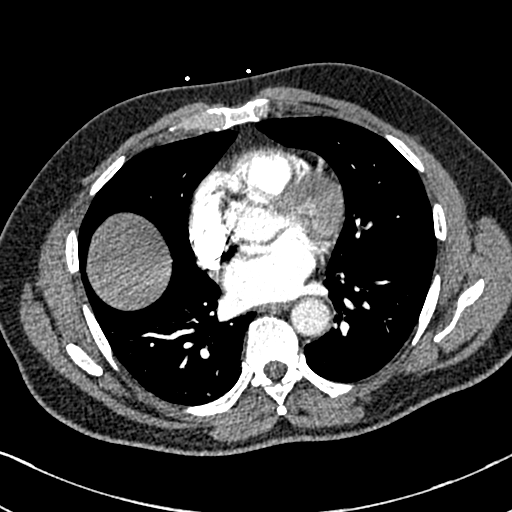
[im 147/281  lung]
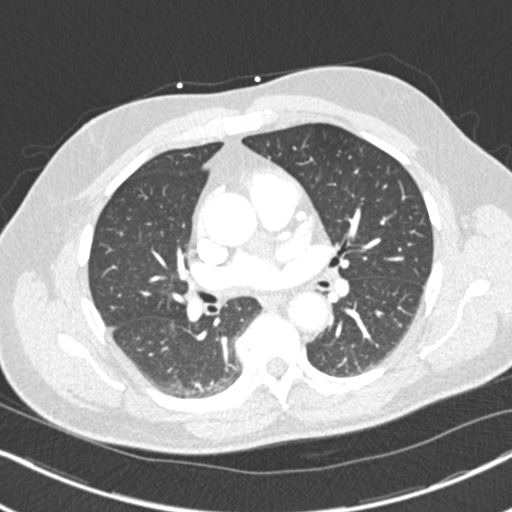
[im 159/281  soft-tissue]
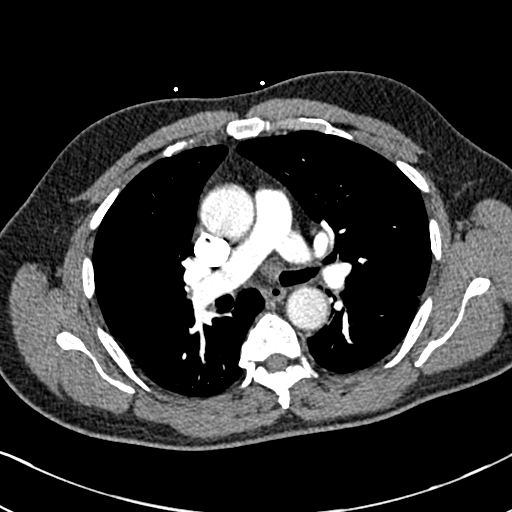
[im 171/281  lung]
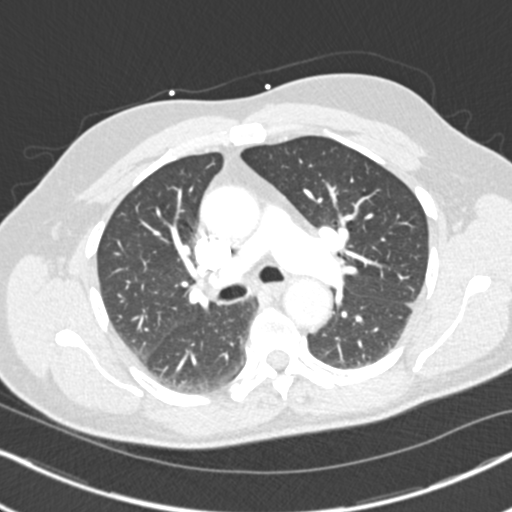
[im 183/281  soft-tissue]
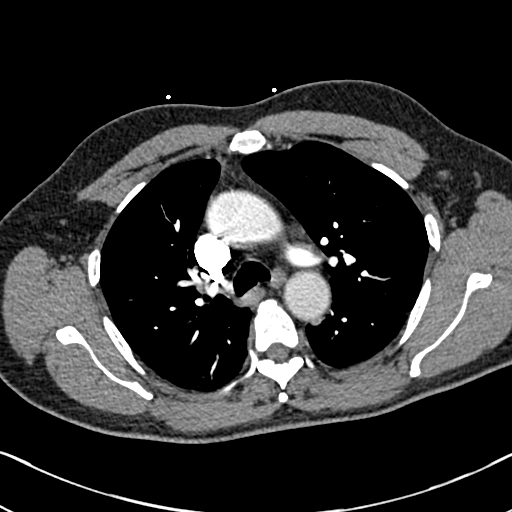
[im 207/281  lung]
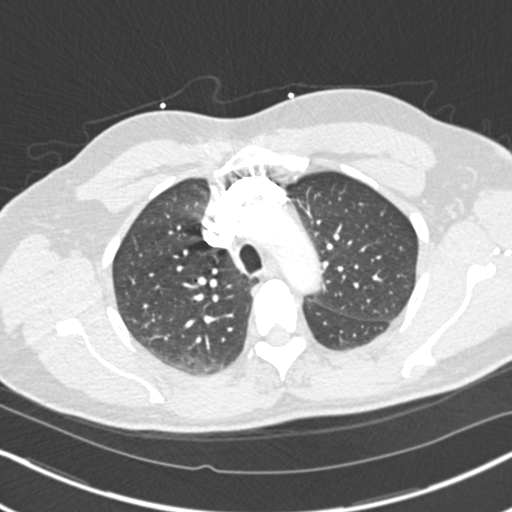
[im 220/281  soft-tissue]
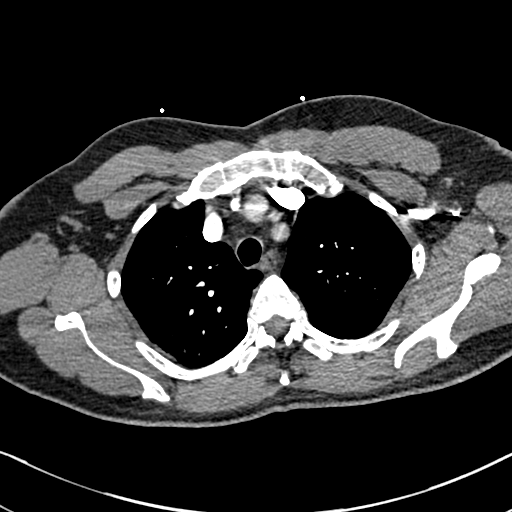
[im 232/281  lung]
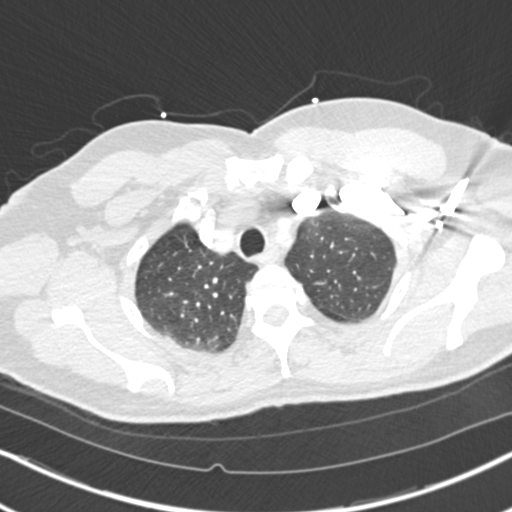
[im 256/281  soft-tissue]
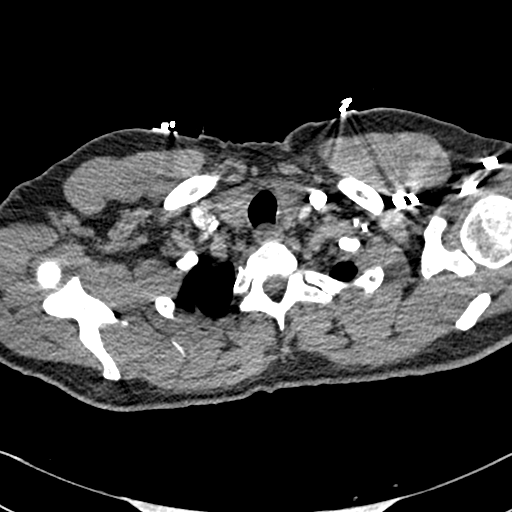
[im 268/281  lung]
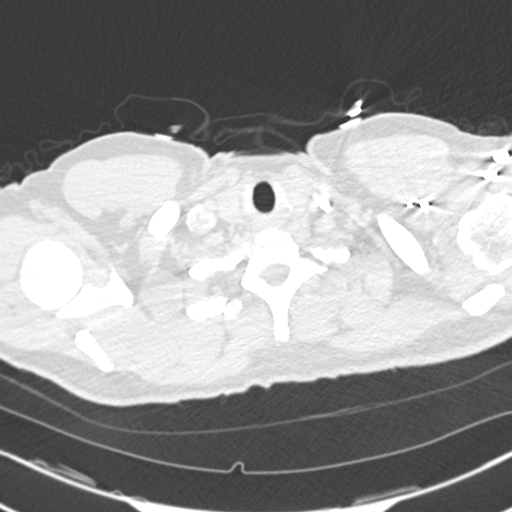

[Series 7: coronal mpr · coronal · 0.55mm/px · 2 of 94 slices shown]
[im 32/94  soft-tissue]
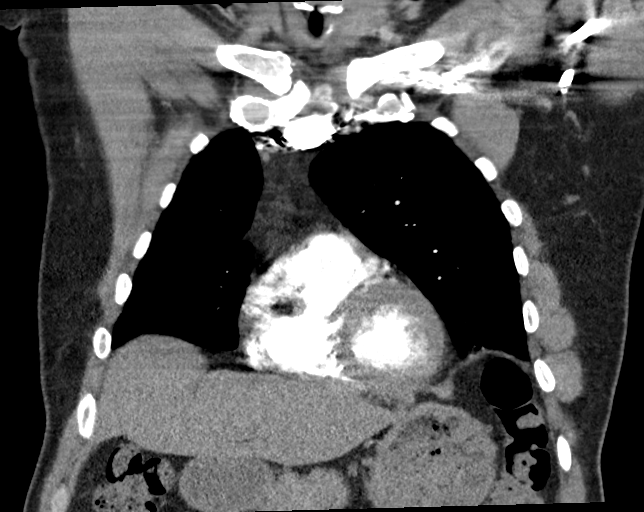
[im 63/94  soft-tissue]
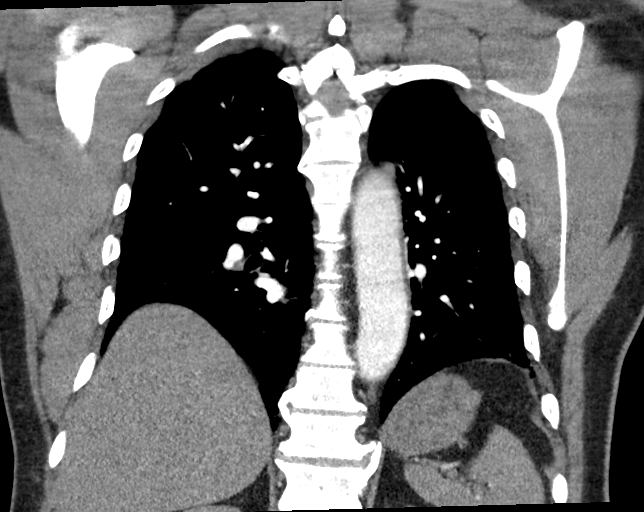

[19 of 46 positions shown; findings below may reference images not displayed]

FINDINGS: Cardiovascular: Adequate contrast bolus timing in the pulmonary
arterial tree.

No focal filling defect identified in the pulmonary arteries to
suggest acute pulmonary embolism.

Some calcified coronary artery atherosclerosis is evident on series
5, image 136. No cardiomegaly or pericardial effusion. Negative
visible aorta.

Mediastinum/Nodes: Negative.

Lungs/Pleura: Major airways are patent. There is mild dependent
atelectasis in both lungs. No pleural effusion or other abnormal
pulmonary opacity.

Upper Abdomen: Negative visible liver, gallbladder, spleen,
pancreas, right adrenal gland, and bowel in the upper abdomen.

Musculoskeletal: No acute osseous abnormality identified.

Review of the MIP images confirms the above findings.
IMPRESSION: 1. Negative for acute pulmonary embolus.
2. Mild pulmonary atelectasis.
3. Calcified coronary artery atherosclerosis.

## 2022-10-23 ENCOUNTER — Other Ambulatory Visit: Payer: Self-pay | Admitting: Internal Medicine

## 2022-10-23 DIAGNOSIS — R972 Elevated prostate specific antigen [PSA]: Secondary | ICD-10-CM

## 2022-10-27 ENCOUNTER — Ambulatory Visit
Admission: RE | Admit: 2022-10-27 | Discharge: 2022-10-27 | Disposition: A | Discharge status: Home / Self Care | Payer: BC Managed Care – PPO | Source: Ambulatory Visit | Attending: Internal Medicine | Admitting: Internal Medicine

## 2022-10-27 DIAGNOSIS — R972 Elevated prostate specific antigen [PSA]: Secondary | ICD-10-CM | POA: Diagnosis present

## 2022-10-27 MED ORDER — GADOBUTROL 1 MMOL/ML IV SOLN
7.5000 mL | Freq: Once | INTRAVENOUS | Status: AC | PRN
  Administered 2022-10-27: 7.5 mL via INTRAVENOUS

## 2022-11-30 ENCOUNTER — Other Ambulatory Visit (HOSPITAL_COMMUNITY): Payer: Self-pay | Admitting: Urology

## 2022-11-30 DIAGNOSIS — C61 Malignant neoplasm of prostate: Secondary | ICD-10-CM

## 2022-12-06 ENCOUNTER — Telehealth: Payer: Self-pay

## 2022-12-06 NOTE — Telephone Encounter (Signed)
Called and left voicemail for patient with upcoming appointment and letting him know I am sending a packet with appointment information and to call with any questions

## 2022-12-21 ENCOUNTER — Encounter (HOSPITAL_COMMUNITY)
Admission: RE | Admit: 2022-12-21 | Discharge: 2022-12-21 | Disposition: A | Discharge status: Home / Self Care | Payer: BC Managed Care – PPO | Source: Ambulatory Visit | Attending: Urology | Admitting: Urology

## 2022-12-21 DIAGNOSIS — C61 Malignant neoplasm of prostate: Secondary | ICD-10-CM

## 2022-12-21 MED ORDER — PIFLIFOLASTAT F 18 (PYLARIFY) INJECTION
9.0000 | Freq: Once | INTRAVENOUS | Status: AC
  Administered 2022-12-21: 9.7 via INTRAVENOUS

## 2022-12-31 DIAGNOSIS — C61 Malignant neoplasm of prostate: Secondary | ICD-10-CM | POA: Insufficient documentation

## 2022-12-31 NOTE — Progress Notes (Unsigned)
                               Care Plan Summary  Name: Argenis Kumari  DOB: 1955-10-20   Your Medical Team:   Urologist -  Dr. Heloise Purpura, [facility]  Radiation Oncologist - Dr. Margaretmary Dys, [facility]     Recommendations: 1) Hormonal Therapy 2) Radiation  3) Surgery      * These recommendations are based on information available as of today's consult.      Recommendations may change depending on the results of further tests or exams.    Next Steps: 1) Consider all your options.  Contact Marisue Ivan, your nurse navigator, with any questions or treatment decisions.      When appointments need to be scheduled, you will be contacted by [facility] and/or [facility].  Questions?  Please do not hesitate to call Cherlyn Cushing, BSN, RN at [phone] with any questions or concerns.  Marisue Ivan is your Oncology Nurse Navigator and is available to assist you while you're receiving your medical care at [facility].

## 2022-12-31 NOTE — Progress Notes (Signed)
RN spoke with patient and reminded him of upcoming [facility] appointment for 4/23.  Patient confirmed he did have his packet and will bring to clinic appointment tomorrow.

## 2023-01-01 ENCOUNTER — Other Ambulatory Visit: Payer: Self-pay | Admitting: Genetic Counselor

## 2023-01-01 ENCOUNTER — Inpatient Hospital Stay: Payer: BC Managed Care – PPO | Attending: Radiation Oncology | Admitting: Genetic Counselor

## 2023-01-01 ENCOUNTER — Inpatient Hospital Stay: Payer: BC Managed Care – PPO

## 2023-01-01 ENCOUNTER — Encounter: Payer: Self-pay | Admitting: Radiation Oncology

## 2023-01-01 ENCOUNTER — Ambulatory Visit
Admission: RE | Admit: 2023-01-01 | Discharge: 2023-01-01 | Disposition: A | Discharge status: Home / Self Care | Payer: BC Managed Care – PPO | Source: Ambulatory Visit | Attending: Radiation Oncology | Admitting: Radiation Oncology

## 2023-01-01 ENCOUNTER — Other Ambulatory Visit: Payer: Self-pay

## 2023-01-01 VITALS — BP 134/81 | HR 85 | Temp 97.3°F | Resp 18 | Ht 71.0 in | Wt 196.6 lb

## 2023-01-01 DIAGNOSIS — Z1379 Encounter for other screening for genetic and chromosomal anomalies: Secondary | ICD-10-CM

## 2023-01-01 DIAGNOSIS — C61 Malignant neoplasm of prostate: Secondary | ICD-10-CM

## 2023-01-01 DIAGNOSIS — Z8042 Family history of malignant neoplasm of prostate: Secondary | ICD-10-CM | POA: Diagnosis not present

## 2023-01-01 HISTORY — DX: Unspecified osteoarthritis, unspecified site: M19.90

## 2023-01-01 HISTORY — DX: Elevated prostate specific antigen (PSA): R97.20

## 2023-01-01 LAB — GENETIC SCREENING ORDER

## 2023-01-01 NOTE — Progress Notes (Signed)
Radiation Oncology         [phone] ________________________________  Multidisciplinary Prostate Cancer Clinic  Initial Radiation Oncology Consultation  Name: Glenn Burns MRN: [id number]  Date: 01/01/2023  DOB: 1956/07/02  WU:[id number], Hardin Negus, MD  Bjorn Pippin, MD   REFERRING PHYSICIAN: Bjorn Pippin, MD  DIAGNOSIS: 67 y.o. gentleman with stage T1c adenocarcinoma of the prostate with a Gleason's score of 4+4 and a PSA of 12.11    ICD-10-CM   1. Malignant neoplasm of prostate  C61       HISTORY OF PRESENT ILLNESS::Glenn Burns is a 67 y.o. gentleman.  He has a history of elevated PSA since at least 2018 when he was initially seen by Dr. Marlou Porch for PSA of 5.3.  The PSA subsequently decreased on repeat labs and the patient was lost to follow up thereafter. More recently, his PSA was further elevated at 8.14 on routine labs by his primary care physician, Dr. Hyacinth Meeker on 10/25/22. His PCP ordered a prostate MRI for further evaluation and this was performed on 10/27/22 showing a PI-RADS 5 lesion in the right mid gland anterior peripheral zone a PI-RADS 4 lesion in the right apical transitional zone and a PI-RADS 3 lesion in the left apical peripheral zone but no definitive signs of extracapsular extension. Accordingly, he was referred for evaluation in urology by Dr. Annabell Howells on 11/02/22,  digital rectal examination performed at that time showed no nodules.  The patient proceeded to MRI fusion biopsy of the prostate on 11/20/22.  The prostate volume measured 84 cc.  Out of 21 core biopsies, 9 were positive.  The maximum Gleason score was 4+4, and this was seen in two cores from the MRI ROI #2. Additionally, Gleason 3+4 was seen in the right apex, left apex, left mid lateral (small focus), and all three cores from the MRI ROI #1, and Gleason 3+3 in the right mid.    A repeat PSA performed with his PCP on 12/20/22 was further elevated at 12.11. He underwent PSMA PET scan for disease staging on 12/21/22  which showed multifocal intense radiotracer activity within the prostate gland but no evidence of metastatic adenopathy, visceral metastasis, or skeletal metastasis.    The patient reviewed the biopsy and imaging results with his urologist and he has kindly been referred today to the multidisciplinary prostate cancer clinic for presentation of pathology and radiology studies in our conference for discussion of potential radiation treatment options and clinical evaluation.  He is accompanied by his wife, Cherylann Ratel, for today's visit.  PREVIOUS RADIATION THERAPY: No  PAST MEDICAL HISTORY:  has a past medical history of Arthritis, Elevated PSA, Headache, Hyperlipidemia, and Hypertension.    PAST SURGICAL HISTORY: Past Surgical History:  Procedure Laterality Date   HERNIA REPAIR     PROSTATE BIOPSY      FAMILY HISTORY: family history includes Prostate cancer in his brother and father.  SOCIAL HISTORY:  reports that he has never smoked. He has never used smokeless tobacco. He reports that he does not drink alcohol and does not use drugs.  ALLERGIES: Patient has no known allergies.  MEDICATIONS:  Current Outpatient Medications  Medication Sig Dispense Refill   amLODipine (NORVASC) 5 MG tablet Take 1 tablet by mouth at bedtime.     tadalafil (CIALIS) 10 MG tablet Take by mouth.     Multiple Vitamin (MULTI-VITAMINS) TABS Take by mouth.     olmesartan (BENICAR) 40 MG tablet Take 40 mg by mouth daily.     potassium chloride (  KLOR-CON) 8 MEQ tablet Take 8 mEq by mouth daily.     simvastatin (ZOCOR) 40 MG tablet Take by mouth.     triamterene-hydrochlorothiazide (MAXZIDE-25) 37.5-25 MG tablet Take by mouth.     No current facility-administered medications for this encounter.    REVIEW OF SYSTEMS:  On review of systems, the patient reports that he is doing well overall. He denies any chest pain, shortness of breath, cough, fevers, chills, night sweats, unintended weight changes. He denies any  bowel disturbances, and denies abdominal pain, nausea or vomiting. He denies any new musculoskeletal or joint aches or pains. His IPSS was 9, indicating mild-moderate urinary symptoms. His SHIM was 13, indicating he has moderate erectile dysfunction. A complete review of systems is obtained and is otherwise negative.   PHYSICAL EXAM:  Wt Readings from Last 3 Encounters:  01/01/23 196 lb 9.6 oz (89.2 kg)  09/19/20 195 lb (88.5 kg)  03/18/17 192 lb (87.1 kg)   Temp Readings from Last 3 Encounters:  01/01/23 (!) 97.3 F (36.3 C) (Oral)  09/20/20 97.8 F (36.6 C) (Oral)   BP Readings from Last 3 Encounters:  01/01/23 134/81  09/20/20 (!) 160/102  03/18/17 (!) 148/97   Pulse Readings from Last 3 Encounters:  01/01/23 85  09/20/20 70  03/18/17 82    /10  In general this is a well appearing African American man in no acute distress. He's alert and oriented x4 and appropriate throughout the examination. Cardiopulmonary assessment is negative for acute distress and he exhibits normal effort.    KPS = 100  100 - Normal; no complaints; no evidence of disease. 90   - Able to carry on normal activity; minor signs or symptoms of disease. 80   - Normal activity with effort; some signs or symptoms of disease. 4   - Cares for self; unable to carry on normal activity or to do active work. 60   - Requires occasional assistance, but is able to care for most of his personal needs. 50   - Requires considerable assistance and frequent medical care. 40   - Disabled; requires special care and assistance. 30   - Severely disabled; hospital admission is indicated although death not imminent. 20   - Very sick; hospital admission necessary; active supportive treatment necessary. 10   - Moribund; fatal processes progressing rapidly. 0     - Dead  Karnofsky DA, Abelmann WH, Craver LS and Burchenal [facility] [phone]) The use of the nitrogen mustards in the palliative treatment of carcinoma: with particular  reference to bronchogenic carcinoma Cancer 1 [id number]   LABORATORY DATA:  Lab Results  Component Value Date   WBC 5.2 09/19/2020   HGB 15.2 09/19/2020   HCT 47.5 09/19/2020   MCV 83.8 09/19/2020   PLT 216 09/19/2020   Lab Results  Component Value Date   NA 140 09/19/2020   K 3.7 09/19/2020   CL 100 09/19/2020   CO2 29 09/19/2020   Lab Results  Component Value Date   ALT 17 09/19/2020   AST 21 09/19/2020   ALKPHOS 46 09/19/2020   BILITOT 0.7 09/19/2020     RADIOGRAPHY: NM PET (PSMA) SKULL TO MID THIGH  Result Date: 12/25/2022 CLINICAL DATA:  Prostate cancer.  Positive biopsy.  PSA equal 12.1 EXAM: NUCLEAR MEDICINE PET SKULL BASE TO THIGH TECHNIQUE: 9.7 mCi F18 Piflufolastat (Pylarify) was injected intravenously. Full-ring PET imaging was performed from the skull base to thigh after the radiotracer. CT data was obtained and used  for attenuation correction and anatomic localization. COMPARISON:  None Available. FINDINGS: NECK No radiotracer activity in neck lymph nodes. Incidental CT finding: None. CHEST No radiotracer accumulation within mediastinal or hilar lymph nodes. No suspicious pulmonary nodules on the CT scan. Incidental CT finding: None. ABDOMEN/PELVIS Prostate: Small foci of intense radiotracer activity within the prostate gland. Focal activity in the RIGHT lateral mid gland with SUV max equal 13.2. Focal activity at the midline apex SUV max equal 11.2. Lymph nodes: No abnormal radiotracer accumulation within pelvic or abdominal nodes. Liver: No evidence of liver metastasis. Incidental CT finding: Multiple diverticula of the descending colon and sigmoid colon without acute inflammation. SKELETON No focal activity to suggest skeletal metastasis. IMPRESSION: 1. Multifocal intense radiotracer activity within the prostate gland consistent with primary prostate adenocarcinoma. 2. No evidence of metastatic adenopathy in the pelvis or periaortic retroperitoneum. 3. No evidence of visceral  metastasis or skeletal metastasis. Electronically Signed   By: Genevive Bi M.D.   On: 12/25/2022 09:44      IMPRESSION/PLAN: 67 y.o. gentleman with Stage T1c adenocarcinoma of the prostate with a Gleason score of 4+4 and a PSA of 12.11.    We discussed the patient's workup and outlined the nature of prostate cancer in this setting. The patient's T stage, Gleason's score, and PSA put him into the high risk group. Accordingly, he is eligible for a variety of potential treatment options including prostatectomy or LT-ADT in combination with either 8 weeks of external radiation or 5 weeks of external radiation with an upfront brachytherapy boost. We discussed the available radiation techniques, and focused on the details and logistics of delivery. The patient is not a candidate for brachytherapy boost with a prostate volume of 84 gm.  Therefore, we discussed and outlined the risks, benefits, short and long-term effects associated with daily external beam radiotherapy and compared and contrasted these with prostatectomy. We discussed the role of SpaceOAR gel in reducing the rectal toxicity associated with radiotherapy. We also detailed the role of ADT in the treatment of high risk prostate cancer and outlined the associated side effects that could be expected with this therapy. He appears to have a good understanding of his disease and our treatment recommendations which are of curative intent.  He and his wife were encouraged to ask questions that were answered to their stated satisfaction.  At the end of the conversation the patient is undecided regarding his treatment preference and would like to take some additional time to consider his options.  He also met with Dr. Laverle Patter to discuss surgical options as part of the multidisciplinary clinic today and is still considering RALP versus LT ADT concurrent with 8 weeks of daily external beam radiation.  We will share our discussion with Dr. Annabell Howells.  He has our  contact information and will let us know if he ultimately decides to proceed with radiotherapy and we will proceed with coordinating for the start of ADT followed by fiducial marker and SpaceOAR gel placement prior to CT SIM, in anticipation of beginning the daily radiation treatments approximately 2 months after starting ADT.  We enjoyed meeting him and his wife today and look forward to continuing to participate in his care.  They know that they are welcome to call anytime with any additional questions or concerns regarding the radiation treatment options discussed today.  We personally spent 60 minutes in this encounter including chart review, reviewing radiological studies, meeting face-to-face with the patient, entering orders and completing documentation.  Nicholos Johns, PA-C    Tyler Pita, MD  [facility] Direct Dial: [phone]  Fax: [phone] [email or web address]  Skype  [email or web address]   This document serves as a record of services personally performed by Tyler Pita, MD and Freeman Caldron, PA-C. It was created on their behalf by Wilburn Mylar, a trained medical scribe. The creation of this record is based on the scribe's personal observations and the provider's statements to them. This document has been checked and approved by the attending provider.

## 2023-01-01 NOTE — Consult Note (Signed)
Multi-Disciplinary Clinic     01/01/2023   --------------------------------------------------------------------------------   Glenn Burns  MRN: [id number]  DOB: 08-20-1956, 67 year old Male  SSN:    PRIMARY CARE:  Bethann Punches, MD  PRIMARY CARE FAX:  [phone]  REFERRING:  Bethann Punches, MD  PROVIDER:  Bjorn Pippin, M.D.  TREATING:  Heloise Purpura, M.D.  LOCATION:  [facility] [phone]     --------------------------------------------------------------------------------   CC/HPI: CC: Prostate Cancer   Physician requesting consult: Dr. Bjorn Pippin  PCP: Dr. Bethann Punches  Location of consult: [facility] - Prostate Cancer Multidisciplinary Clinic   Glenn Burns is a 67 year old gentleman with a past medical history for hypertension, hyperlipidemia, and arthritis who was found to have an elevated PSA of 8.14. This prompted an MRI of the prostate by Dr. Hyacinth Meeker on 10/27/22 that indicated 3 concerning lesions including an 8 mm PI-RADS 4 lesion of the right apex transition zone, a 1.7 cm PI-RADS 5 lesion of the right mid peripheral zone, and a 1.1 cm PI-RADS 3 lesion of the left apex peripheral zone. He was seen by Dr. Annabell Howells and an MR/US fusion biopsy was performed on 11/20/22 that confirmed Gleason 4+4=8 adenocarcinoma with 9 out of 21 biopsies positive for malignancy including 4 of 12 systematic biopsies, all 3 ROI-1 biopsies, and 2 out of 3 ROI-2 biopsies. PSMA PET imaging was ordered and was done on 12/21/2022. This did not demonstrate metastatic disease.   Family history: Brother.   Imaging studies:  MRI (10/27/22) - No LAD, SVI, or bone lesions. No clear EPE but a large area abutting the right sided prostatic capsule raising concern  PSMA PET scan (12/21/2022): No evidence of metastatic disease.   PMH: He has a history of arthritis, hyperlipidemia, and hypertension.  PSH: Open umbilical hernia repair with mesh.   TNM stage: cT1c N0 M0  PSA: 8.14   Gleason score: 4+4=8 (GG 4)  Biopsy (11/20/22): 9/21 cores positive  Left: L apex (10%, 3+4=7), L lateral mid (5%, 3+4=7)  Right: R apex (50%, 3+4=7), R mid (10%, 3+3=6)  Prostate volume: 84 cc   Nomogram  OC disease: 34%  EPE: 64%  SVI: 11%  LNI: 15%  PFS (5 year, 10 year): 48%, 33%   Urinary function: IPSS is 9.  Erectile function: SHIM score is 13. He could not obtain erections but has difficulty maintaining erections. He has not tried PDE 5 inhibitors previously.     ALLERGIES: No Allergies     Notes: He is not allergic to either of these meds. He is taking them for allergies.    MEDICATIONS: Simvastatin 40 mg tablet  Amlodipine Besylate 5 mg tablet  Centrum  Diazepam 10 mg tablet 1-2 tablet PO Daily Take one hour prior to procedure.  Levofloxacin 750 mg tablet 1 po 1 hour prior to the procedure  Olmesartan Medoxomil 40 mg tablet  Potassium Chloride  Sudafed  Triamterene  Trimethoprim     GU PSH: Prostate Needle Biopsy - 11/20/2022     NON-GU PSH: Hernia Repair Surgical Pathology, Gross And Microscopic Examination For Prostate Needle - 11/20/2022     GU PMH: Abnormal radiologic findings on diagnostic imaging of other urinary organs - 11/20/2022, - 11/02/2022 Elevated PSA - 11/20/2022, He has a rising PSA that is up to 8.14 with an MRI that shows PIRADS 5, 4 and 3 lesions and he has a family history of prostate cancer in his brother. He needs  an MR fusion biopsy. I reviewed the risks of bleeding, infection and voiding difficulty. Levofloxin sent. , - 11/02/2022 BPH w/LUTS - 11/02/2022 Weak Urinary Stream - 11/02/2022    NON-GU PMH: Arthritis Hypercholesterolemia Hypertension    FAMILY HISTORY: 2 daughters - Other 1 son - Other Death In The Family Father - Father Prostate Cancer - Runs in Family   SOCIAL HISTORY: Marital Status: Married Preferred Language: English; Ethnicity: Not Hispanic Or Latino; Race: Black or African American Current Smoking Status: Patient  has never smoked.   Tobacco Use Assessment Completed: Used Tobacco in last 30 days? Does not use smokeless tobacco. Has never drank.  Does not use drugs. Drinks 1 caffeinated drink per day. 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  Gastrointestinal (Upper):   Patient denies nausea and vomiting.  Gastrointestinal (Lower):   Patient denies diarrhea and constipation.  Constitutional:   Patient denies fever, night sweats, weight loss, and fatigue.  Skin:   Patient denies skin rash/ lesion and itching.  Eyes:   Patient denies blurred vision and double vision.  Ears/ Nose/ Throat:   Patient denies sore throat and sinus problems.  Hematologic/Lymphatic:   Patient denies swollen glands and easy bruising.  Cardiovascular:   Patient denies leg swelling and chest pains.  Respiratory:   Patient denies cough and shortness of breath.  Endocrine:   Patient denies excessive thirst.  Musculoskeletal:   Patient denies back pain and joint pain.  Neurological:   Patient denies headaches and dizziness.  Psychologic:   Patient denies depression and anxiety.   VITAL SIGNS: None   GU PHYSICAL EXAMINATION:    Prostate: Prostate about 50 grams. Left lobe normal consistency, right lobe normal consistency. Symmetrical lobes. No prostate nodule. Left lobe no tenderness, right lobe no tenderness.    MULTI-SYSTEM PHYSICAL EXAMINATION:    Constitutional: Well-nourished. No physical deformities. Normally developed. Good grooming.  Respiratory: No labored breathing, no use of accessory muscles.   Cardiovascular: Normal temperature, normal extremity pulses, no swelling, no varicosities.  Gastrointestinal: No mass, no tenderness,  no rigidity, non obese abdomen. He has a well-healed scar over the superior aspect of his abdomen consistent with his umbilical hernia repair.     Complexity of Data:  Lab Test Review:   PSA  Records Review:   Pathology Reports, Previous Patient Records  X-Ray Review: PET- PSMA Scan: Reviewed Films.     10/25/22 10/10/21 10/03/20 12/04/19 03/15/17  PSA  Total PSA 8.14 ng/ml 6.33 ng/ml 5.95 ng/ml 5.9 ng/ml 5.3 ng/ml   Notes:                     CLINICAL DATA: Prostate cancer. Positive biopsy. PSA equal 12.1   EXAM:  NUCLEAR MEDICINE PET SKULL BASE TO THIGH   TECHNIQUE:  9.7 mCi F18 Piflufolastat (Pylarify) was injected intravenously.  Full-ring PET imaging was performed from the skull base to thigh  after the radiotracer. CT data was obtained and used for attenuation  correction and anatomic localization.   COMPARISON: None Available.   FINDINGS:  NECK   No radiotracer activity in neck lymph nodes.   Incidental CT finding: None.   CHEST   No radiotracer accumulation within  mediastinal or hilar lymph nodes.  No suspicious pulmonary nodules on the CT scan.   Incidental CT finding: None.   ABDOMEN/PELVIS   Prostate: Small foci of intense radiotracer activity within the  prostate gland. Focal activity in the RIGHT lateral mid gland with  SUV max equal 13.2. Focal activity at the midline apex SUV max equal  11.2.   Lymph nodes: No abnormal radiotracer accumulation within pelvic or  abdominal nodes.   Liver: No evidence of liver metastasis.   Incidental CT finding: Multiple diverticula of the descending colon  and sigmoid colon without acute inflammation.   SKELETON   No focal activity to suggest skeletal metastasis.   IMPRESSION:  1. Multifocal intense radiotracer activity within the prostate gland  consistent with primary prostate adenocarcinoma.  2. No evidence of metastatic adenopathy in the pelvis or periaortic  retroperitoneum.  3. No evidence of visceral  metastasis or skeletal metastasis.    Electronically Signed  By: Genevive Bi M.D.  On: 12/25/2022 09:44   PROCEDURES: None   ASSESSMENT:      ICD-10 Details  1 GU:   Prostate Cancer - C61    PLAN:           Document Letter(s):  Created for Patient: Clinical Summary         Notes:   1. High risk prostate cancer: I had a detailed discussion with Mr. Glenn Burns and his wife today. We reviewed his PSMA PET scan indicating no evidence of metastatic disease. He has been counseled by Dr. Kathrynn Running earlier this afternoon and is well-informed regarding his radiotherapy options. I also recommended therapy of curative intent considering his life expectancy and high risk disease parameters.   The patient was counseled about the natural history of prostate cancer and the standard treatment options that are available for prostate cancer. It was explained to him how his age and life expectancy, clinical stage, Gleason score/prognostic grade group, and PSA (and PSA density) affect his prognosis, the decision to proceed with additional staging studies, as well as how that information influences recommended treatment strategies. We discussed the roles for active surveillance, radiation therapy, surgical therapy, androgen deprivation, as well as ablative therapy and other investigational options for the treatment of prostate cancer as appropriate to his individual cancer situation. We discussed the risks and benefits of these options with regard to their impact on cancer control and also in terms of potential adverse events, complications, and impact on quality of life particularly related to urinary and sexual function. The patient was encouraged to ask questions throughout the discussion today and all questions were answered to his stated satisfaction. In addition, the patient was provided with and/or directed to appropriate resources and literature for further education about prostate cancer and treatment  options. We discussed surgical therapy for prostate cancer including the different available surgical approaches. We discussed, in detail, the risks and expectations of surgery with regard to cancer control, urinary control, and erectile function as well as the expected postoperative recovery process. Additional risks of surgery including but not limited to bleeding, infection, hernia formation, nerve damage, lymphocele formation, bowel/rectal injury potentially necessitating colostomy, damage to the urinary tract resulting in urine leakage, urethral stricture, and the cardiopulmonary risks such as myocardial infarction, stroke, death, venothromboembolism, etc. were explained. The risk of open surgical conversion for robotic/laparoscopic prostatectomy was also discussed.   He is going to further consider his options. If he chooses to proceed with surgical therapy, my tentative plan would be to perform  a bilateral partial nerve sparing robot-assisted laparoscopic radical prostatectomy and bilateral pelvic lymphadenectomy. He understands the low chance for recovery of erections after surgery and understands that we would certainly be careful particularly right side of the prostate where there is a large area of abutment to the lesion seen on his MRI.   CC: Dr. Bethann Punches  Dr. Bjorn Pippin  Dr. Margaretmary Dys    E & M CODES: We spent 47 minutes dedicated to evaluation and management time, including face to face interaction, discussions on coordination of care, documentation, result review, and discussion with others as applicable.

## 2023-01-02 ENCOUNTER — Encounter: Payer: Self-pay | Admitting: Genetic Counselor

## 2023-01-02 NOTE — Progress Notes (Signed)
REFERRING PROVIDER: Margaretmary Dys, MD  PRIMARY PROVIDER:  Danella Penton, MD  PRIMARY REASON FOR VISIT:  1. Malignant neoplasm of prostate   2. Family history of prostate cancer    HISTORY OF PRESENT ILLNESS:   Glenn Burns, a 67 y.o. male, was seen for a [facility] cancer genetics consultation at the request of Dr. Kathrynn Running due to a personal and family history of cancer.  Glenn Burns presents to clinic today to discuss the possibility of a hereditary predisposition to cancer, to discuss genetic testing, and to further clarify his future cancer risks, as well as potential cancer risks for family members.   Glenn Burns was diagnosed with prostate cancer at age 48 (Gleason score: 8).  CANCER HISTORY:  Oncology History  Malignant neoplasm of prostate  11/20/2022 Cancer Staging   Staging form: Prostate, AJCC 8th Edition - Clinical stage from 11/20/2022: Stage IIC (cT1c, cN0, cM0, PSA: 8.1, Grade Group: 4) - Signed by Marcello Fennel, PA-C on 12/31/2022 Histopathologic type: Adenocarcinoma, NOS Stage prefix: Initial diagnosis Prostate specific antigen (PSA) range: Less than 10 Gleason primary pattern: 4 Gleason secondary pattern: 4 Gleason score: 8 Histologic grading system: 5 grade system Number of biopsy cores examined: 21 Number of biopsy cores positive: 9 Location of positive needle core biopsies: Both sides   12/31/2022 Initial Diagnosis   Malignant neoplasm of prostate     Past Medical History:  Diagnosis Date   Arthritis    Elevated PSA    Headache    Hyperlipidemia    Hypertension     Past Surgical History:  Procedure Laterality Date   HERNIA REPAIR     PROSTATE BIOPSY      Social History   Socioeconomic History   Marital status: Married    Spouse name: Lear Ng. Reish   Number of children: 4   Years of education: Not on file   Highest education level: Not on file  Occupational History   Occupation: Personnel officer  Tobacco Use   Smoking status: Never    Smokeless tobacco: Never  Vaping Use   Vaping Use: Never used  Substance and Sexual Activity   Alcohol use: Never   Drug use: No   Sexual activity: Not on file  Other Topics Concern   Not on file  Social History Narrative   Not on file   Social Determinants of Health   Financial Resource Strain: Not on file  Food Insecurity: Not on file  Transportation Needs: Not on file  Physical Activity: Not on file  Stress: Not on file  Social Connections: Not on file     FAMILY HISTORY:  We obtained a detailed, 4-generation family history.  Significant diagnoses are listed below: Family History  Problem Relation Age of Onset   Prostate cancer Father    Prostate cancer Brother 14       metastatic   Bladder Cancer Neg Hx    Kidney cancer Neg Hx      Glenn Burns has one brother and his brother was recently diagnosed with metastatic prostate cancer at age 33. His father died due to metastatic prostate cancer at age 29. Of note, he has limited information about his family history. Glenn Burns is unaware of previous family history of genetic testing for hereditary cancer risks. There is no reported Ashkenazi Jewish ancestry.   GENETIC COUNSELING ASSESSMENT: Glenn Burns is a 67 y.o. male with a personal and family history of cancer which is somewhat suggestive of a hereditary predisposition to cancer  given his high risk prostate cancer. We, therefore, discussed and recommended the following at today's visit.   DISCUSSION: We discussed that 5 - 10% of cancer is hereditary, with most cases of prostate cancer associated with BRCA1/2.  There are other genes that can be associated with hereditary prostate cancer syndromes.  We discussed that testing is beneficial for several reasons including knowing how to follow individuals after completing their treatment, identifying whether potential treatment options would be beneficial, and understanding if other family members could be at risk for cancer and  allowing them to undergo genetic testing.   We reviewed the characteristics, features and inheritance patterns of hereditary cancer syndromes. We also discussed genetic testing, including the appropriate family members to test, the process of testing, insurance coverage and turn-around-time for results. We discussed the implications of a negative, positive, carrier and/or variant of uncertain significant result. We recommended Glenn Burns pursue genetic testing for a panel that includes genes associated with prostate cancer.   Glenn Burns elected to have [facility] Custom Panel. The Custom Hereditary Cancers Panel offered by [facility] includes sequencing and/or deletion duplication testing of the following 43 genes: APC, ATM, AXIN2, BAP1, BARD1, BMPR1A, BRCA1, BRCA2, BRIP1, CDH1, CDK4, CDKN2A (p14ARF and p16INK4a only), CHEK2, CTNNA1, EPCAM (Deletion/duplication testing only), FH, GREM1 (promoter region duplication testing only), HOXB13, KIT, MBD4, MEN1, MLH1, MSH2, MSH3, MSH6, MUTYH, NF1, NHTL1, PALB2, PDGFRA, PMS2, POLD1, POLE, PTEN, RAD51C, RAD51D, SMAD4, SMARCA4. STK11, TP53, TSC1, TSC2, and VHL.  Based on Glenn Burns personal and family history of cancer, he meets medical criteria for genetic testing. Despite that he meets criteria, he may still have an out of pocket cost. We discussed that if his out of pocket cost for testing is over $100, the laboratory will call and confirm whether he wants to proceed with testing.  If the out of pocket cost of testing is less than $100 he will be billed by the genetic testing laboratory.   PLAN: After considering the risks, benefits, and limitations, Glenn Burns provided informed consent to pursue genetic testing and the blood sample was sent to [facility] for analysis of the Custom Panel. Results should be available within approximately 2-3 weeks' time, at which point they will be disclosed by telephone to Glenn Burns, as will any additional recommendations  warranted by these results. Glenn Burns will receive a summary of his genetic counseling visit and a copy of his results once available. This information will also be available in Epic.   Glenn Burns questions were answered to his satisfaction today. Our contact information was provided should additional questions or concerns arise. Thank you for the referral and allowing [place] to share in the care of your patient.   Lalla Brothers, MS, [facility] Genetic Counselor [email or web address] (P) [phone]  The patient was seen for a total of 20 minutes in face-to-face genetic counseling.  The patient brought his wife. Drs. Pamelia Hoit and/or Mosetta Putt were available to discuss this case as needed.   _______________________________________________________________________ For Office Staff:  Number of people involved in session: 2 Was an Intern/ student involved with case: no

## 2023-01-03 ENCOUNTER — Telehealth: Payer: Self-pay

## 2023-01-03 NOTE — Telephone Encounter (Signed)
Patient called expressing he would like to move forward with the seed process, I told him I would get with the team and he would get a call soon

## 2023-01-08 NOTE — Progress Notes (Signed)
Patient was presented at the [facility] on 4/23 for his stage T1c adenocarcinoma of the prostate with a Gleason's score of 4+4 and a PSA of 12.11.   Patient has confirmed he would like to proceed with LT-ADT with 8 weeks of daily external beam radiation.   MD's notified.  Awaiting confirmation of start date for ADT.   Patient aware he will be contacted once authorization/appointment has been made.   Plan of care in progress.

## 2023-01-11 ENCOUNTER — Encounter: Payer: Self-pay | Admitting: Genetic Counselor

## 2023-01-11 ENCOUNTER — Telehealth: Payer: Self-pay | Admitting: Genetic Counselor

## 2023-01-11 DIAGNOSIS — Z1379 Encounter for other screening for genetic and chromosomal anomalies: Secondary | ICD-10-CM | POA: Insufficient documentation

## 2023-01-11 NOTE — Progress Notes (Signed)
RN spoke with staff at [facility] to follow up regarding starting LT-ADT.   Review in process and will contact patient to get scheduled.   RN will continue to follow.

## 2023-01-11 NOTE — Telephone Encounter (Signed)
I contacted Mr. Townsend to discuss his genetic testing results. No pathogenic variants were identified in the 43 genes analyzed. Detailed clinic note to follow.  The test report has been scanned into EPIC and is located under the Molecular Pathology section of the Results Review tab.  A portion of the result report is included below for reference.   Lalla Brothers, MS, [facility] Genetic Counselor [email or web address] (P) [phone]

## 2023-01-14 ENCOUNTER — Encounter: Payer: Self-pay | Admitting: Genetic Counselor

## 2023-01-14 ENCOUNTER — Ambulatory Visit: Payer: Self-pay | Admitting: Genetic Counselor

## 2023-01-14 DIAGNOSIS — Z1379 Encounter for other screening for genetic and chromosomal anomalies: Secondary | ICD-10-CM

## 2023-01-14 NOTE — Progress Notes (Signed)
HPI:   Mr. Nykaza was previously seen in the [facility] due to a personal and family history of cancer and concerns regarding a hereditary predisposition to cancer. Please refer to our prior cancer genetics clinic note for more information regarding our discussion, assessment and recommendations, at the time. Mr. Mathiesen recent genetic test results were disclosed to him, as were recommendations warranted by these results. These results and recommendations are discussed in more detail below.  CANCER HISTORY:  Oncology History  Malignant neoplasm of prostate (HCC)  11/20/2022 Cancer Staging   Staging form: Prostate, AJCC 8th Edition - Clinical stage from 11/20/2022: Stage IIC (cT1c, cN0, cM0, PSA: 8.1, Grade Group: 4) - Signed by Marcello Fennel, PA-C on 12/31/2022 Histopathologic type: Adenocarcinoma, NOS Stage prefix: Initial diagnosis Prostate specific antigen (PSA) range: Less than 10 Gleason primary pattern: 4 Gleason secondary pattern: 4 Gleason score: 8 Histologic grading system: 5 grade system Number of biopsy cores examined: 21 Number of biopsy cores positive: 9 Location of positive needle core biopsies: Both sides   12/31/2022 Initial Diagnosis   Malignant neoplasm of prostate     FAMILY HISTORY:  We obtained a detailed, 4-generation family history.  Significant diagnoses are listed below:      Family History  Problem Relation Age of Onset   Prostate cancer Father     Prostate cancer Brother 60        metastatic   Bladder Cancer Neg Hx     Kidney cancer Neg Hx         Mr. Bingaman has one brother and his brother was recently diagnosed with metastatic prostate cancer at age 11. His father died due to metastatic prostate cancer at age 44. Of note, he has limited information about his family history. Mr. Meighen is unaware of previous family history of genetic testing for hereditary cancer risks. There is no reported Ashkenazi Jewish ancestry.    GENETIC TEST RESULTS:  The [facility] Custom Panel found no pathogenic mutations.  The Custom Hereditary Cancers Panel offered by [facility] includes sequencing and/or deletion duplication testing of the following 43 genes: APC, ATM, AXIN2, BAP1, BARD1, BMPR1A, BRCA1, BRCA2, BRIP1, CDH1, CDK4, CDKN2A (p14ARF and p16INK4a only), CHEK2, CTNNA1, EPCAM (Deletion/duplication testing only), FH, GREM1 (promoter region duplication testing only), HOXB13, KIT, MBD4, MEN1, MLH1, MSH2, MSH3, MSH6, MUTYH, NF1, NHTL1, PALB2, PDGFRA, PMS2, POLD1, POLE, PTEN, RAD51C, RAD51D, SMAD4, SMARCA4. STK11, TP53, TSC1, TSC2, and VHL.   The test report has been scanned into EPIC and is located under the Molecular Pathology section of the Results Review tab.  A portion of the result report is included below for reference. Genetic testing reported out on 01/08/2023.       Even though a pathogenic variant was not identified, possible explanations for the cancer in the family may include: There may be no hereditary risk for cancer in the family. The cancers in Mr. Bechen and/or his family may be due to other genetic or environmental factors. There may be a gene mutation in one of these genes that current testing methods cannot detect, but that chance is small. There could be another gene that has not yet been discovered, or that we have not yet tested, that is responsible for the cancer diagnoses in the family.  It is also possible there is a hereditary cause for the cancer in the family that Mr. Waite did not inherit.  Therefore, it is important to remain in touch with cancer genetics in the future so  that we can continue to offer Mr. Raatz the most up to date genetic testing.   ADDITIONAL GENETIC TESTING:  We discussed with Mr. Haataja that his genetic testing was fairly extensive.  If there are genes identified to increase cancer risk that can be analyzed in the future, we would be happy to discuss and coordinate this  testing at that time.    CANCER SCREENING RECOMMENDATIONS:  Mr. Fay test result is considered negative (normal).  This means that we have not identified a hereditary cause for his personal and family history of cancer at this time. Most cancers happen by chance and this negative test suggests that his cancer may fall into this category.    An individual's cancer risk and medical management are not determined by genetic test results alone. Overall cancer risk assessment incorporates additional factors, including personal medical history, family history, and any available genetic information that may result in a personalized plan for cancer prevention and surveillance. Therefore, it is recommended he continue to follow the cancer management and screening guidelines provided by his oncology and primary healthcare provider.  RECOMMENDATIONS FOR FAMILY MEMBERS:   Since he did not inherit a mutation in a cancer predisposition gene included on this panel, his children could not have inherited a mutation from him in one of these genes. Other members of the family may still carry a pathogenic variant in one of these genes that Mr. Candelaria did not inherit. Based on the family history, we recommend his brother, who was diagnosed with prostate cancer, have genetic counseling and testing.   FOLLOW-UP:  Cancer genetics is a rapidly advancing field and it is possible that new genetic tests will be appropriate for him and/or his family members in the future. We encouraged him to remain in contact with cancer genetics on an annual basis so we can update his personal and family histories and let him know of advances in cancer genetics that may benefit this family.   Our contact number was provided. Mr. Pole questions were answered to his satisfaction, and he knows he is welcome to call us at anytime with additional questions or concerns.   Lalla Brothers, MS, [facility] Genetic  Counselor [email or web address] (P) [phone]

## 2023-01-15 NOTE — Progress Notes (Signed)
RN spoke with staff at [facility] to review request for ADT for patient.   Patient is currently scheduled for 7/5 and RN placed request placed to get patient in sooner.   [facility] reviewing to get patient in sooner and will provide update.  RN updated patient, will continue to follow to ensure patient gets scheduled appropriately.

## 2023-01-17 NOTE — Progress Notes (Signed)
Patient is scheduled for ADT at [facility] on 5/17 at 8:30am.   RN spoke with patient, he is aware of appointment.  All questions answered.   Will continue to follow.   Plan of care in progress.

## 2023-01-22 ENCOUNTER — Other Ambulatory Visit: Payer: Self-pay | Admitting: Urology

## 2023-01-25 ENCOUNTER — Encounter: Payer: Self-pay | Admitting: Urology

## 2023-01-25 NOTE — Progress Notes (Signed)
Patient will start ADT at [facility] today and is scheduled for FM/SO procedure 03/19/23 in anticipation of beginning his 8 week course of daily external beam radiation shortly thereafter.  Marguarite Arbour, MMS, PA-C [facility] Radiation Oncology Physician Assistant Direct Dial: [phone]  Fax: [phone]

## 2023-02-14 NOTE — Progress Notes (Signed)
RN spoke with Huntley Dec @ [employer] to confirm patient's treatment plan.

## 2023-02-25 ENCOUNTER — Encounter (HOSPITAL_BASED_OUTPATIENT_CLINIC_OR_DEPARTMENT_OTHER): Payer: Self-pay | Admitting: Urology

## 2023-02-25 NOTE — Progress Notes (Signed)
Spoke w/ via phone for pre-op interview--- [employer]---- ISTAT and EKG              Lab results------ COVID test -----patient states asymptomatic no test needed Arrive at -------0630 NPO after MN NO Solid Food.   Med rec completed Medications to take morning of surgery -----NONE Diabetic medication ----- Patient instructed no nail polish to be worn day of surgery Patient instructed to bring photo id and insurance card day of surgery Patient aware to have Driver (ride ) / caregiver Wife- Glenn Burns   for 24 hours after surgery  Patient Special Instructions -----FLEETS enema AM of procedure. Patient verbalized understanding. Pre-Op special Instructions ----- Patient verbalized understanding of instructions that were given at this phone interview. Patient denies shortness of breath, chest pain, fever, cough at this phone interview.

## 2023-03-15 NOTE — Progress Notes (Signed)
RN left message for patient to call back to assess and needs or questions prior to upcoming fiducial's, spaceOAR, and CT Simulation appointments.

## 2023-03-19 ENCOUNTER — Ambulatory Visit (HOSPITAL_BASED_OUTPATIENT_CLINIC_OR_DEPARTMENT_OTHER): Payer: BC Managed Care – PPO | Admitting: Certified Registered Nurse Anesthetist

## 2023-03-19 ENCOUNTER — Ambulatory Visit (HOSPITAL_BASED_OUTPATIENT_CLINIC_OR_DEPARTMENT_OTHER)
Admission: RE | Admit: 2023-03-19 | Discharge: 2023-03-19 | Disposition: A | Discharge status: Home / Self Care | Payer: BC Managed Care – PPO | Attending: Urology | Admitting: Urology

## 2023-03-19 ENCOUNTER — Encounter (HOSPITAL_BASED_OUTPATIENT_CLINIC_OR_DEPARTMENT_OTHER)
Admission: RE | Disposition: A | Discharge status: Home / Self Care | Payer: Self-pay | Source: Home / Self Care | Attending: Urology

## 2023-03-19 ENCOUNTER — Encounter (HOSPITAL_BASED_OUTPATIENT_CLINIC_OR_DEPARTMENT_OTHER): Payer: Self-pay | Admitting: Urology

## 2023-03-19 DIAGNOSIS — I10 Essential (primary) hypertension: Secondary | ICD-10-CM | POA: Diagnosis not present

## 2023-03-19 DIAGNOSIS — C61 Malignant neoplasm of prostate: Secondary | ICD-10-CM | POA: Diagnosis present

## 2023-03-19 HISTORY — DX: Malignant (primary) neoplasm, unspecified: C80.1

## 2023-03-19 HISTORY — PX: GOLD SEED IMPLANT: SHX6343

## 2023-03-19 HISTORY — PX: SPACE OAR INSTILLATION: SHX6769

## 2023-03-19 LAB — POCT I-STAT, CHEM 8
BUN: 22 mg/dL (ref 8–23)
Calcium, Ion: 1.28 mmol/L (ref 1.15–1.40)
Chloride: 106 mmol/L (ref 98–111)
Creatinine, Ser: 0.9 mg/dL (ref 0.61–1.24)
Glucose, Bld: 92 mg/dL (ref 70–99)
HCT: 45 % (ref 39.0–52.0)
Hemoglobin: 15.3 g/dL (ref 13.0–17.0)
Potassium: 3.4 mmol/L — ABNORMAL LOW (ref 3.5–5.1)
Sodium: 143 mmol/L (ref 135–145)
TCO2: 26 mmol/L (ref 22–32)

## 2023-03-19 SURGERY — INSERTION, GOLD SEEDS
Anesthesia: Monitor Anesthesia Care | Site: Prostate

## 2023-03-19 MED ORDER — SODIUM CHLORIDE (PF) 0.9 % IJ SOLN
INTRAMUSCULAR | Status: DC | PRN
  Administered 2023-03-19: 10 mL

## 2023-03-19 MED ORDER — OXYCODONE HCL 5 MG PO TABS: 5.0000 mg | ORAL_TABLET | Freq: Once | ORAL | Status: DC | PRN

## 2023-03-19 MED ORDER — CEFAZOLIN SODIUM-DEXTROSE 2-4 GM/100ML-% IV SOLN
INTRAVENOUS | Status: AC
  Filled 2023-03-19: qty 100

## 2023-03-19 MED ORDER — PROPOFOL 10 MG/ML IV BOLUS
INTRAVENOUS | Status: DC | PRN
  Administered 2023-03-19: 20 mg via INTRAVENOUS

## 2023-03-19 MED ORDER — FENTANYL CITRATE (PF) 100 MCG/2ML IJ SOLN: 25.0000 ug | INTRAMUSCULAR | Status: DC | PRN

## 2023-03-19 MED ORDER — LACTATED RINGERS IV SOLN: INTRAVENOUS | Status: DC

## 2023-03-19 MED ORDER — DEXMEDETOMIDINE HCL IN NACL 80 MCG/20ML IV SOLN
INTRAVENOUS | Status: DC | PRN
  Administered 2023-03-19: 8 ug via INTRAVENOUS

## 2023-03-19 MED ORDER — AMISULPRIDE (ANTIEMETIC) 5 MG/2ML IV SOLN: 10.0000 mg | Freq: Once | INTRAVENOUS | Status: DC | PRN

## 2023-03-19 MED ORDER — PHENYLEPHRINE 80 MCG/ML (10ML) SYRINGE FOR IV PUSH (FOR BLOOD PRESSURE SUPPORT)
PREFILLED_SYRINGE | INTRAVENOUS | Status: DC | PRN
  Administered 2023-03-19: 80 ug via INTRAVENOUS

## 2023-03-19 MED ORDER — BUPIVACAINE HCL (PF) 0.25 % IJ SOLN
INTRAMUSCULAR | Status: DC | PRN
  Administered 2023-03-19: 10 mL

## 2023-03-19 MED ORDER — PROPOFOL 500 MG/50ML IV EMUL
INTRAVENOUS | Status: AC
  Filled 2023-03-19: qty 150

## 2023-03-19 MED ORDER — FLEET ENEMA 7-19 GM/118ML RE ENEM: 1.0000 | ENEMA | Freq: Once | RECTAL | Status: DC

## 2023-03-19 MED ORDER — OXYCODONE HCL 5 MG/5ML PO SOLN: 5.0000 mg | Freq: Once | ORAL | Status: DC | PRN

## 2023-03-19 MED ORDER — PROPOFOL 500 MG/50ML IV EMUL
INTRAVENOUS | Status: DC | PRN
  Administered 2023-03-19: 180 ug/kg/min via INTRAVENOUS

## 2023-03-19 MED ORDER — DEXMEDETOMIDINE HCL IN NACL 80 MCG/20ML IV SOLN
INTRAVENOUS | Status: AC
  Filled 2023-03-19: qty 20

## 2023-03-19 MED ORDER — CEFAZOLIN SODIUM-DEXTROSE 2-4 GM/100ML-% IV SOLN
2.0000 g | INTRAVENOUS | Status: AC
  Administered 2023-03-19: 2 g via INTRAVENOUS

## 2023-03-19 MED ORDER — LIDOCAINE 2% (20 MG/ML) 5 ML SYRINGE
INTRAMUSCULAR | Status: DC | PRN
  Administered 2023-03-19: 40 mg via INTRAVENOUS

## 2023-03-19 MED ORDER — PROPOFOL 1000 MG/100ML IV EMUL
INTRAVENOUS | Status: AC
  Filled 2023-03-19: qty 200

## 2023-03-19 SURGICAL SUPPLY — 25 items
BLADE CLIPPER SENSICLIP SURGIC (BLADE) ×1 IMPLANT
CNTNR URN SCR LID CUP LEK RST (MISCELLANEOUS) ×1 IMPLANT
CONT SPEC 4OZ STRL OR WHT (MISCELLANEOUS) ×1
COVER BACK TABLE 60X90IN (DRAPES) ×1 IMPLANT
DRSG TEGADERM 4X4.75 (GAUZE/BANDAGES/DRESSINGS) ×1 IMPLANT
DRSG TEGADERM 8X12 (GAUZE/BANDAGES/DRESSINGS) ×1 IMPLANT
GAUZE SPONGE 4X4 12PLY STRL (GAUZE/BANDAGES/DRESSINGS) ×1 IMPLANT
GLOVE BIO SURGEON STRL SZ7.5 (GLOVE) ×1 IMPLANT
GLOVE SURG ORTHO 8.5 STRL (GLOVE) ×1 IMPLANT
IMPL SPACEOAR VUE SYSTEM (Spacer) ×1 IMPLANT
IMPLANT SPACEOAR VUE SYSTEM (Spacer) ×1 IMPLANT
KIT TURNOVER CYSTO (KITS) ×1 IMPLANT
MARKER GOLD PRELOAD 1.2X3 (Urological Implant) ×1 IMPLANT
MARKER SKIN DUAL TIP RULER LAB (MISCELLANEOUS) ×1 IMPLANT
NDL SPNL 22GX3.5 QUINCKE BK (NEEDLE) ×1 IMPLANT
NEEDLE SPNL 22GX3.5 QUINCKE BK (NEEDLE) ×1 IMPLANT
SEED GOLD PRELOAD 1.2X3 (Urological Implant) ×1 IMPLANT
SHEATH ULTRASOUND LF (SHEATH) ×1 IMPLANT
SHEATH ULTRASOUND LTX NONSTRL (SHEATH) IMPLANT
SLEEVE SCD COMPRESS KNEE MED (STOCKING) ×1 IMPLANT
SURGILUBE 2OZ TUBE FLIPTOP (MISCELLANEOUS) ×1 IMPLANT
SYR 10ML LL (SYRINGE) IMPLANT
SYR CONTROL 10ML LL (SYRINGE) ×1 IMPLANT
TOWEL OR 17X24 6PK STRL BLUE (TOWEL DISPOSABLE) ×1 IMPLANT
UNDERPAD 30X36 HEAVY ABSORB (UNDERPADS AND DIAPERS) ×1 IMPLANT

## 2023-03-19 NOTE — Discharge Instructions (Signed)
  Post Anesthesia Home Care Instructions  Activity: Get plenty of rest for the remainder of the day. A responsible individual must stay with you for 24 hours following the procedure.  For the next 24 hours, DO NOT: -Drive a car -Advertising copywriter -Drink alcoholic beverages -Take any medication unless instructed by your physician -Make any legal decisions or sign important papers.  Meals: Start with liquid foods such as gelatin or soup. Progress to regular foods as tolerated. Avoid greasy, spicy, heavy foods. If nausea and/or vomiting occur, drink only clear liquids until the nausea and/or vomiting subsides. Call your physician if vomiting continues.  Special Instructions/Symptoms: Your throat may feel dry or sore from the anesthesia or the breathing tube placed in your throat during surgery. If this causes discomfort, gargle with warm salt water. The discomfort should disappear within 24 hours.  Call your surgeon if you experience:   1.  Fever over 101.0. 2.  Inability to urinate. 3.  Nausea and/or vomiting. 4.  Extreme swelling or bruising at the surgical site. 5.  Continued bleeding from the incision. 6.  Increased pain, redness or drainage from the incision. 7.  Problems related to your pain medication. 8.  Any problems and/or concerns.  9. May remove dressing 03/20/2023 and shower.

## 2023-03-19 NOTE — Transfer of Care (Signed)
Immediate Anesthesia Transfer of Care Note  Patient: Glenn Burns  Procedure(s) Performed: GOLD SEED IMPLANT (Prostate) SPACE OAR INSTILLATION (Prostate)  Patient Location: PACU  Anesthesia Type:MAC  Level of Consciousness: sedated  Airway & Oxygen Therapy: Patient Spontanous Breathing and Patient connected to face mask oxygen  Post-op Assessment: Report given to RN and Post -op Vital signs reviewed and stable  Post vital signs: Reviewed and stable  Last Vitals:  Vitals Value Taken Time  BP 92/65 03/19/23 0924  Temp 36.4 C 03/19/23 0924  Pulse 72 03/19/23 0924  Resp 16 03/19/23 0924  SpO2 96 % 03/19/23 0924    Last Pain:  Vitals:   03/19/23 0738  TempSrc: Oral  PainSc: 0-No pain      Patients Stated Pain Goal: 6 (03/19/23 0738)  Complications: No notable events documented.

## 2023-03-19 NOTE — Anesthesia Preprocedure Evaluation (Addendum)
Anesthesia Evaluation  Patient identified by MRN, date of birth, ID band Patient awake    Reviewed: Allergy & Precautions, NPO status , Patient's Chart, lab work & pertinent test results  Airway Mallampati: III  TM Distance: >3 FB Neck ROM: Full    Dental  (+) Dental Advisory Given, Partial Upper   Pulmonary neg pulmonary ROS   Pulmonary exam normal breath sounds clear to auscultation       Cardiovascular hypertension, Pt. on medications Normal cardiovascular exam Rhythm:Regular Rate:Normal     Neuro/Psych  Headaches    GI/Hepatic negative GI ROS, Neg liver ROS,,,  Endo/Other  negative endocrine ROS    Renal/GU negative Renal ROS     Musculoskeletal  (+) Arthritis ,    Abdominal   Peds  Hematology negative hematology ROS (+)   Anesthesia Other Findings   Reproductive/Obstetrics                             Anesthesia Physical Anesthesia Plan  ASA: 2  Anesthesia Plan: MAC   Post-op Pain Management: Minimal or no pain anticipated   Induction: Intravenous  PONV Risk Score and Plan: 2 and Ondansetron, Treatment may vary due to age or medical condition, Propofol infusion and TIVA  Airway Management Planned: Natural Airway  Additional Equipment: None  Intra-op Plan:   Post-operative Plan:   Informed Consent: I have reviewed the patients History and Physical, chart, labs and discussed the procedure including the risks, benefits and alternatives for the proposed anesthesia with the patient or authorized representative who has indicated his/her understanding and acceptance.     Dental advisory given  Plan Discussed with: CRNA  Anesthesia Plan Comments:         Anesthesia Quick Evaluation

## 2023-03-19 NOTE — Op Note (Signed)
Operative Note   Preoperative diagnosis:  1.  Grade 4 prostate cancer   Postoperative diagnosis: 1.  Same    Procedure(s): 1.  Transrectal ultrasound guided prostatic gold seed fiducial marker and Space OAR placement   Surgeon: Rhoderick Moody, MD   Assistants:  None    Anesthesia:  MAC   Complications:  None   EBL:  <5 mL   Specimens: 1. None   Drains/Catheters: 1.  None   Intraoperative findings:   Fiducial markers were placed at the prostatic base, mid-gland and apex.  SpaceOAR was placed with good separation of the prostate and rectum.    Indication: Glenn Burns a 67 y.o. male, currently followed by Dr. Annabell Howells, with grade 4 prostate cancer.  He has elected to proceed with XRT as primary treatment and is here today for gold seed fiducial marker and SpaceOAR placement.  He has been consented for the above procedures, voices understanding and wishes to proceed.    Description of procedure:   After informed consent the patient was brought to the major OR, placed on the table and administered general anesthesia. He was then moved to the modified lithotomy position with his perineum perpendicular to the floor. His perineum and genitalia were then sterilely prepped. An official timeout was then performed.    Real time transrectal ultrasonography was used visualize the prostate.  Gold seed fiducial markers were then placed transperineally at the prostatic base, mid gland and apex.   I then proceeded with placement of SpaceOAR by introducing a needle with the bevel angled inferiorly approximately 2 cm superior to the anus. This was angled downward and under direct ultrasound was placed within the space between the prostatic capsule and rectum. This was confirmed with a small amount of sterile saline injected and this was performed under direct ultrasound. I then attached the SpaceOAR to the needle and injected this in the space between the prostate and rectum with good placement  noted.  The patient tolerated the procedure well and was transferred to the postanesthesia in stable condition.   Plan: Discharge home

## 2023-03-19 NOTE — H&P (Signed)
Urology Preoperative H&P   Chief Complaint: Prostate cancer   History of Present Illness: Glenn Burns is a 67 y.o. male, currently followed by Dr. Annabell Howells, with grade 4 prostate cancer who has elected to undergo external beam radiation therapy as primary treatment and is here today for gold seed fiducial marker and SpaceOAR placement.  He is voiding without difficulty denies interval UTIs, dysuria or hematuria.    Past Medical History:  Diagnosis Date   Arthritis    Cancer (HCC)    Elevated PSA    Headache    Hyperlipidemia    Hypertension     Past Surgical History:  Procedure Laterality Date   HERNIA REPAIR     PROSTATE BIOPSY      Allergies: No Known Allergies  Family History  Problem Relation Age of Onset   Prostate cancer Father    Prostate cancer Brother 31       metastatic   Bladder Cancer Neg Hx    Kidney cancer Neg Hx     Social History:  reports that he has never smoked. He has never used smokeless tobacco. He reports that he does not drink alcohol and does not use drugs.  ROS: A complete review of systems was performed.  All systems are negative except for pertinent findings as noted.  Physical Exam:  Vital signs in last 24 hours:   Constitutional:  Alert and oriented, No acute distress Cardiovascular: Regular rate and rhythm, No JVD Respiratory: Normal respiratory effort, Lungs clear bilaterally GI: Abdomen is soft, nontender, nondistended, no abdominal masses GU: No CVA tenderness Lymphatic: No lymphadenopathy Neurologic: Grossly intact, no focal deficits Psychiatric: Normal mood and affect  Laboratory Data:  No results for input(s): "WBC", "HGB", "HCT", "PLT" in the last 72 hours.  No results for input(s): "NA", "K", "CL", "GLUCOSE", "BUN", "CALCIUM", "CREATININE" in the last 72 hours.  Invalid input(s): "CO3"   No results found for this or any previous visit (from the past 24 hour(s)). No results found for this or any previous visit (from  the past 240 hour(s)).  Renal Function: No results for input(s): "CREATININE" in the last 168 hours. CrCl cannot be calculated (Patient's most recent lab result is older than the maximum 21 days allowed.).  Radiologic Imaging: No results found.  I independently reviewed the above imaging studies.  Assessment and Plan Glenn Burns is a 67 y.o. male with grade 4 prostate cancer  The risk, benefits and alternatives of gold seed fiducial marker and SpaceOAR placement was discussed with the patient.  Risk include, but are not limited to, bleeding, urinary tract infection, perineal infection, urethral injury, rectal irritation/ulceration, MI, CVA, DVT and the inherent risk of general anesthesia.  He voices understanding and wishes to proceed.  Rhoderick Moody, MD 03/19/2023, 7:34 AM  [facility] Pager: [phone]

## 2023-03-20 ENCOUNTER — Encounter (HOSPITAL_BASED_OUTPATIENT_CLINIC_OR_DEPARTMENT_OTHER): Payer: Self-pay | Admitting: Urology

## 2023-03-20 NOTE — Anesthesia Postprocedure Evaluation (Signed)
Anesthesia Post Note  Patient: Glenn Burns  Procedure(s) Performed: GOLD SEED IMPLANT (Prostate) SPACE OAR INSTILLATION (Prostate)     Patient location during evaluation: PACU Anesthesia Type: MAC Level of consciousness: awake and alert Pain management: pain level controlled Vital Signs Assessment: post-procedure vital signs reviewed and stable Respiratory status: spontaneous breathing Cardiovascular status: stable Anesthetic complications: no   No notable events documented.  Last Vitals:  Vitals:   03/19/23 1000 03/19/23 1030  BP: 118/84 131/85  Pulse: 64 60  Resp: 17 16  Temp:  36.6 C  SpO2: 97% 97%    Last Pain:  Vitals:   03/19/23 1015  TempSrc:   PainSc: 0-No pain                 Lewie Loron

## 2023-04-02 ENCOUNTER — Telehealth: Payer: Self-pay | Admitting: *Deleted

## 2023-04-02 NOTE — Telephone Encounter (Signed)
CALLED PATIENT TO REMIND OF SIM APPT. FOR 04-04-23- ARRIVAL TIME- 9:45 AM @ [facility], INFORMED PATIENT TO ARRIVE WITH A FULL BLADDER, SPOKE WITH PATIENT AND HE IS AWARE OF THIS APPT. AND THE INSTRUCTIONS

## 2023-04-04 ENCOUNTER — Other Ambulatory Visit: Payer: Self-pay

## 2023-04-04 ENCOUNTER — Ambulatory Visit
Admission: RE | Admit: 2023-04-04 | Discharge: 2023-04-04 | Disposition: A | Discharge status: Home / Self Care | Payer: BC Managed Care – PPO | Source: Ambulatory Visit | Attending: Radiation Oncology | Admitting: Radiation Oncology

## 2023-04-04 DIAGNOSIS — C61 Malignant neoplasm of prostate: Secondary | ICD-10-CM | POA: Diagnosis present

## 2023-04-04 NOTE — Progress Notes (Signed)
  Radiation Oncology         [phone] ________________________________  Name: Zak Gondek MRN: [id number]  Date: 04/04/2023  DOB: Oct 28, 1955  SIMULATION AND TREATMENT PLANNING NOTE    ICD-10-CM   1. Malignant neoplasm of prostate (HCC)  C61       DIAGNOSIS:   67 y.o. gentleman with stage T1c adenocarcinoma of the prostate with a Gleason's score of 4+4 and a PSA of 12.11   NARRATIVE:  The patient was brought to the CT Simulation planning suite.  Identity was confirmed.  All relevant records and images related to the planned course of therapy were reviewed.  The patient freely provided informed written consent to proceed with treatment after reviewing the details related to the planned course of therapy. The consent form was witnessed and verified by the simulation staff.  Then, the patient was set-up in a stable reproducible supine position for radiation therapy.  A vacuum lock pillow device was custom fabricated to position his legs in a reproducible immobilized position.  Then, I performed a urethrogram under sterile conditions to identify the prostatic apex.  CT images were obtained.  Surface markings were placed.  The CT images were loaded into the planning software.  Then the prostate target and avoidance structures including the rectum, bladder, bowel and hips were contoured.  Treatment planning then occurred.  The radiation prescription was entered and confirmed.  A total of one complex treatment devices was fabricated. I have requested : Intensity Modulated Radiotherapy (IMRT) is medically necessary for this case for the following reason:  Rectal sparing.Marland Kitchen  PLAN:   The prostate, seminal vesicles, and pelvic lymph nodes will initially be treated to 45 Gy in 25 fractions of 1.8 Gy followed by a boost to the prostate only, to 75 Gy with 15 additional fractions of 2.0 Gy; concurrent with LT-ADT (started Orgovyx 01/25/23).   ________________________________  Artist Pais Kathrynn Running, M.D.

## 2023-04-08 DIAGNOSIS — C61 Malignant neoplasm of prostate: Secondary | ICD-10-CM | POA: Diagnosis not present

## 2023-04-11 NOTE — Progress Notes (Signed)
RN spoke with patient prior to start of radiation to address any new barriers or questions.  No additional needs at this time.

## 2023-04-15 ENCOUNTER — Ambulatory Visit
Admission: RE | Admit: 2023-04-15 | Discharge: 2023-04-15 | Disposition: A | Discharge status: Home / Self Care | Payer: BC Managed Care – PPO | Source: Ambulatory Visit | Attending: Radiation Oncology | Admitting: Radiation Oncology

## 2023-04-15 ENCOUNTER — Other Ambulatory Visit: Payer: Self-pay

## 2023-04-15 DIAGNOSIS — C61 Malignant neoplasm of prostate: Secondary | ICD-10-CM | POA: Diagnosis present

## 2023-04-15 LAB — RAD ONC ARIA SESSION SUMMARY
Course Elapsed Days: 0
Plan Fractions Treated to Date: 1
Plan Prescribed Dose Per Fraction: 1.8 Gy
Plan Total Fractions Prescribed: 25
Plan Total Prescribed Dose: 45 Gy
Reference Point Dosage Given to Date: 1.8 Gy
Reference Point Session Dosage Given: 1.8 Gy
Session Number: 1

## 2023-04-16 ENCOUNTER — Ambulatory Visit
Admission: RE | Admit: 2023-04-16 | Discharge: 2023-04-16 | Disposition: A | Discharge status: Home / Self Care | Payer: BC Managed Care – PPO | Source: Ambulatory Visit | Attending: Radiation Oncology | Admitting: Radiation Oncology

## 2023-04-16 ENCOUNTER — Other Ambulatory Visit: Payer: Self-pay

## 2023-04-16 DIAGNOSIS — C61 Malignant neoplasm of prostate: Secondary | ICD-10-CM | POA: Diagnosis not present

## 2023-04-16 LAB — RAD ONC ARIA SESSION SUMMARY
Course Elapsed Days: 1
Plan Fractions Treated to Date: 2
Plan Prescribed Dose Per Fraction: 1.8 Gy
Plan Total Fractions Prescribed: 25
Plan Total Prescribed Dose: 45 Gy
Reference Point Dosage Given to Date: 3.6 Gy
Reference Point Session Dosage Given: 1.8 Gy
Session Number: 2

## 2023-04-17 ENCOUNTER — Other Ambulatory Visit: Payer: Self-pay

## 2023-04-17 ENCOUNTER — Ambulatory Visit
Admission: RE | Admit: 2023-04-17 | Discharge: 2023-04-17 | Disposition: A | Discharge status: Home / Self Care | Payer: BC Managed Care – PPO | Source: Ambulatory Visit | Attending: Radiation Oncology | Admitting: Radiation Oncology

## 2023-04-17 DIAGNOSIS — C61 Malignant neoplasm of prostate: Secondary | ICD-10-CM | POA: Diagnosis not present

## 2023-04-17 LAB — RAD ONC ARIA SESSION SUMMARY
Course Elapsed Days: 2
Plan Fractions Treated to Date: 3
Plan Prescribed Dose Per Fraction: 1.8 Gy
Plan Total Fractions Prescribed: 25
Plan Total Prescribed Dose: 45 Gy
Reference Point Dosage Given to Date: 5.4 Gy
Reference Point Session Dosage Given: 1.8 Gy
Session Number: 3

## 2023-04-18 ENCOUNTER — Other Ambulatory Visit: Payer: Self-pay

## 2023-04-18 ENCOUNTER — Ambulatory Visit
Admission: RE | Admit: 2023-04-18 | Discharge: 2023-04-18 | Disposition: A | Discharge status: Home / Self Care | Payer: BC Managed Care – PPO | Source: Ambulatory Visit | Attending: Radiation Oncology | Admitting: Radiation Oncology

## 2023-04-18 DIAGNOSIS — C61 Malignant neoplasm of prostate: Secondary | ICD-10-CM | POA: Diagnosis not present

## 2023-04-18 LAB — RAD ONC ARIA SESSION SUMMARY
Course Elapsed Days: 3
Plan Fractions Treated to Date: 4
Plan Prescribed Dose Per Fraction: 1.8 Gy
Plan Total Fractions Prescribed: 25
Plan Total Prescribed Dose: 45 Gy
Reference Point Dosage Given to Date: 7.2 Gy
Reference Point Session Dosage Given: 1.8 Gy
Session Number: 4

## 2023-04-19 ENCOUNTER — Other Ambulatory Visit: Payer: Self-pay

## 2023-04-19 ENCOUNTER — Ambulatory Visit
Admission: RE | Admit: 2023-04-19 | Discharge: 2023-04-19 | Disposition: A | Discharge status: Home / Self Care | Payer: BC Managed Care – PPO | Source: Ambulatory Visit | Attending: Radiation Oncology | Admitting: Radiation Oncology

## 2023-04-19 DIAGNOSIS — C61 Malignant neoplasm of prostate: Secondary | ICD-10-CM | POA: Diagnosis not present

## 2023-04-19 LAB — RAD ONC ARIA SESSION SUMMARY
Course Elapsed Days: 4
Plan Fractions Treated to Date: 5
Plan Prescribed Dose Per Fraction: 1.8 Gy
Plan Total Fractions Prescribed: 25
Plan Total Prescribed Dose: 45 Gy
Reference Point Dosage Given to Date: 9 Gy
Reference Point Session Dosage Given: 1.8 Gy
Session Number: 5

## 2023-04-22 ENCOUNTER — Ambulatory Visit
Admission: RE | Admit: 2023-04-22 | Discharge: 2023-04-22 | Disposition: A | Discharge status: Home / Self Care | Payer: BC Managed Care – PPO | Source: Ambulatory Visit | Attending: Radiation Oncology | Admitting: Radiation Oncology

## 2023-04-22 ENCOUNTER — Other Ambulatory Visit: Payer: Self-pay

## 2023-04-22 DIAGNOSIS — C61 Malignant neoplasm of prostate: Secondary | ICD-10-CM | POA: Diagnosis not present

## 2023-04-22 LAB — RAD ONC ARIA SESSION SUMMARY
Course Elapsed Days: 7
Plan Fractions Treated to Date: 6
Plan Prescribed Dose Per Fraction: 1.8 Gy
Plan Total Fractions Prescribed: 25
Plan Total Prescribed Dose: 45 Gy
Reference Point Dosage Given to Date: 10.8 Gy
Reference Point Session Dosage Given: 1.8 Gy
Session Number: 6

## 2023-04-23 ENCOUNTER — Other Ambulatory Visit: Payer: Self-pay

## 2023-04-23 ENCOUNTER — Ambulatory Visit
Admission: RE | Admit: 2023-04-23 | Discharge: 2023-04-23 | Disposition: A | Discharge status: Home / Self Care | Payer: BC Managed Care – PPO | Source: Ambulatory Visit | Attending: Radiation Oncology | Admitting: Radiation Oncology

## 2023-04-23 DIAGNOSIS — C61 Malignant neoplasm of prostate: Secondary | ICD-10-CM | POA: Diagnosis not present

## 2023-04-23 LAB — RAD ONC ARIA SESSION SUMMARY
Course Elapsed Days: 8
Plan Fractions Treated to Date: 7
Plan Prescribed Dose Per Fraction: 1.8 Gy
Plan Total Fractions Prescribed: 25
Plan Total Prescribed Dose: 45 Gy
Reference Point Dosage Given to Date: 12.6 Gy
Reference Point Session Dosage Given: 1.8 Gy
Session Number: 7

## 2023-04-24 ENCOUNTER — Ambulatory Visit
Admission: RE | Admit: 2023-04-24 | Discharge: 2023-04-24 | Disposition: A | Discharge status: Home / Self Care | Payer: BC Managed Care – PPO | Source: Ambulatory Visit | Attending: Radiation Oncology | Admitting: Radiation Oncology

## 2023-04-24 ENCOUNTER — Other Ambulatory Visit: Payer: Self-pay

## 2023-04-24 DIAGNOSIS — C61 Malignant neoplasm of prostate: Secondary | ICD-10-CM | POA: Diagnosis not present

## 2023-04-24 LAB — RAD ONC ARIA SESSION SUMMARY
Course Elapsed Days: 9
Plan Fractions Treated to Date: 8
Plan Prescribed Dose Per Fraction: 1.8 Gy
Plan Total Fractions Prescribed: 25
Plan Total Prescribed Dose: 45 Gy
Reference Point Dosage Given to Date: 14.4 Gy
Reference Point Session Dosage Given: 1.8 Gy
Session Number: 8

## 2023-04-25 ENCOUNTER — Other Ambulatory Visit: Payer: Self-pay

## 2023-04-25 ENCOUNTER — Ambulatory Visit
Admission: RE | Admit: 2023-04-25 | Discharge: 2023-04-25 | Disposition: A | Discharge status: Home / Self Care | Payer: BC Managed Care – PPO | Source: Ambulatory Visit | Attending: Radiation Oncology | Admitting: Radiation Oncology

## 2023-04-25 DIAGNOSIS — C61 Malignant neoplasm of prostate: Secondary | ICD-10-CM | POA: Diagnosis not present

## 2023-04-25 LAB — RAD ONC ARIA SESSION SUMMARY
Course Elapsed Days: 10
Plan Fractions Treated to Date: 9
Plan Prescribed Dose Per Fraction: 1.8 Gy
Plan Total Fractions Prescribed: 25
Plan Total Prescribed Dose: 45 Gy
Reference Point Dosage Given to Date: 16.2 Gy
Reference Point Session Dosage Given: 1.8 Gy
Session Number: 9

## 2023-04-26 ENCOUNTER — Ambulatory Visit
Admission: RE | Admit: 2023-04-26 | Discharge: 2023-04-26 | Disposition: A | Discharge status: Home / Self Care | Payer: BC Managed Care – PPO | Source: Ambulatory Visit | Attending: Radiation Oncology | Admitting: Radiation Oncology

## 2023-04-26 ENCOUNTER — Ambulatory Visit: Admission: RE | Admit: 2023-04-26 | Payer: BC Managed Care – PPO | Source: Ambulatory Visit

## 2023-04-26 ENCOUNTER — Other Ambulatory Visit: Payer: Self-pay

## 2023-04-26 DIAGNOSIS — C61 Malignant neoplasm of prostate: Secondary | ICD-10-CM | POA: Diagnosis not present

## 2023-04-26 LAB — RAD ONC ARIA SESSION SUMMARY
Course Elapsed Days: 11
Plan Fractions Treated to Date: 10
Plan Prescribed Dose Per Fraction: 1.8 Gy
Plan Total Fractions Prescribed: 25
Plan Total Prescribed Dose: 45 Gy
Reference Point Dosage Given to Date: 18 Gy
Reference Point Session Dosage Given: 1.8 Gy
Session Number: 10

## 2023-04-29 ENCOUNTER — Ambulatory Visit
Admission: RE | Admit: 2023-04-29 | Discharge: 2023-04-29 | Disposition: A | Discharge status: Home / Self Care | Payer: BC Managed Care – PPO | Source: Ambulatory Visit | Attending: Radiation Oncology | Admitting: Radiation Oncology

## 2023-04-29 ENCOUNTER — Other Ambulatory Visit: Payer: Self-pay

## 2023-04-29 DIAGNOSIS — C61 Malignant neoplasm of prostate: Secondary | ICD-10-CM | POA: Diagnosis not present

## 2023-04-29 LAB — RAD ONC ARIA SESSION SUMMARY
Course Elapsed Days: 14
Plan Fractions Treated to Date: 11
Plan Prescribed Dose Per Fraction: 1.8 Gy
Plan Total Fractions Prescribed: 25
Plan Total Prescribed Dose: 45 Gy
Reference Point Dosage Given to Date: 19.8 Gy
Reference Point Session Dosage Given: 1.8 Gy
Session Number: 11

## 2023-04-30 ENCOUNTER — Ambulatory Visit
Admission: RE | Admit: 2023-04-30 | Discharge: 2023-04-30 | Disposition: A | Discharge status: Home / Self Care | Payer: BC Managed Care – PPO | Source: Ambulatory Visit | Attending: Radiation Oncology | Admitting: Radiation Oncology

## 2023-04-30 ENCOUNTER — Other Ambulatory Visit: Payer: Self-pay

## 2023-04-30 DIAGNOSIS — C61 Malignant neoplasm of prostate: Secondary | ICD-10-CM | POA: Diagnosis not present

## 2023-04-30 LAB — RAD ONC ARIA SESSION SUMMARY
Course Elapsed Days: 15
Plan Fractions Treated to Date: 12
Plan Prescribed Dose Per Fraction: 1.8 Gy
Plan Total Fractions Prescribed: 25
Plan Total Prescribed Dose: 45 Gy
Reference Point Dosage Given to Date: 21.6 Gy
Reference Point Session Dosage Given: 1.8 Gy
Session Number: 12

## 2023-05-01 ENCOUNTER — Other Ambulatory Visit: Payer: Self-pay

## 2023-05-01 ENCOUNTER — Ambulatory Visit
Admission: RE | Admit: 2023-05-01 | Discharge: 2023-05-01 | Disposition: A | Discharge status: Home / Self Care | Payer: BC Managed Care – PPO | Source: Ambulatory Visit | Attending: Radiation Oncology | Admitting: Radiation Oncology

## 2023-05-01 DIAGNOSIS — C61 Malignant neoplasm of prostate: Secondary | ICD-10-CM | POA: Diagnosis not present

## 2023-05-01 LAB — RAD ONC ARIA SESSION SUMMARY
Course Elapsed Days: 16
Plan Fractions Treated to Date: 13
Plan Prescribed Dose Per Fraction: 1.8 Gy
Plan Total Fractions Prescribed: 25
Plan Total Prescribed Dose: 45 Gy
Reference Point Dosage Given to Date: 23.4 Gy
Reference Point Session Dosage Given: 1.8 Gy
Session Number: 13

## 2023-05-02 ENCOUNTER — Other Ambulatory Visit: Payer: Self-pay

## 2023-05-02 ENCOUNTER — Ambulatory Visit
Admission: RE | Admit: 2023-05-02 | Discharge: 2023-05-02 | Disposition: A | Discharge status: Home / Self Care | Payer: BC Managed Care – PPO | Source: Ambulatory Visit | Attending: Radiation Oncology | Admitting: Radiation Oncology

## 2023-05-02 DIAGNOSIS — C61 Malignant neoplasm of prostate: Secondary | ICD-10-CM | POA: Diagnosis not present

## 2023-05-02 LAB — RAD ONC ARIA SESSION SUMMARY
Course Elapsed Days: 17
Plan Fractions Treated to Date: 14
Plan Prescribed Dose Per Fraction: 1.8 Gy
Plan Total Fractions Prescribed: 25
Plan Total Prescribed Dose: 45 Gy
Reference Point Dosage Given to Date: 25.2 Gy
Reference Point Session Dosage Given: 1.8 Gy
Session Number: 14

## 2023-05-03 ENCOUNTER — Ambulatory Visit
Admission: RE | Admit: 2023-05-03 | Discharge: 2023-05-03 | Disposition: A | Discharge status: Home / Self Care | Payer: BC Managed Care – PPO | Source: Ambulatory Visit | Attending: Radiation Oncology | Admitting: Radiation Oncology

## 2023-05-03 ENCOUNTER — Other Ambulatory Visit: Payer: Self-pay

## 2023-05-03 DIAGNOSIS — C61 Malignant neoplasm of prostate: Secondary | ICD-10-CM | POA: Diagnosis not present

## 2023-05-03 LAB — RAD ONC ARIA SESSION SUMMARY
Course Elapsed Days: 18
Plan Fractions Treated to Date: 15
Plan Prescribed Dose Per Fraction: 1.8 Gy
Plan Total Fractions Prescribed: 25
Plan Total Prescribed Dose: 45 Gy
Reference Point Dosage Given to Date: 27 Gy
Reference Point Session Dosage Given: 1.8 Gy
Session Number: 15

## 2023-05-06 ENCOUNTER — Other Ambulatory Visit: Payer: Self-pay

## 2023-05-06 ENCOUNTER — Ambulatory Visit
Admission: RE | Admit: 2023-05-06 | Discharge: 2023-05-06 | Disposition: A | Discharge status: Home / Self Care | Payer: BC Managed Care – PPO | Source: Ambulatory Visit | Attending: Radiation Oncology | Admitting: Radiation Oncology

## 2023-05-06 DIAGNOSIS — C61 Malignant neoplasm of prostate: Secondary | ICD-10-CM | POA: Diagnosis not present

## 2023-05-06 LAB — RAD ONC ARIA SESSION SUMMARY
Course Elapsed Days: 21
Plan Fractions Treated to Date: 16
Plan Prescribed Dose Per Fraction: 1.8 Gy
Plan Total Fractions Prescribed: 25
Plan Total Prescribed Dose: 45 Gy
Reference Point Dosage Given to Date: 28.8 Gy
Reference Point Session Dosage Given: 1.8 Gy
Session Number: 16

## 2023-05-07 ENCOUNTER — Other Ambulatory Visit: Payer: Self-pay

## 2023-05-07 ENCOUNTER — Ambulatory Visit
Admission: RE | Admit: 2023-05-07 | Discharge: 2023-05-07 | Disposition: A | Discharge status: Home / Self Care | Payer: BC Managed Care – PPO | Source: Ambulatory Visit | Attending: Radiation Oncology | Admitting: Radiation Oncology

## 2023-05-07 DIAGNOSIS — C61 Malignant neoplasm of prostate: Secondary | ICD-10-CM | POA: Diagnosis not present

## 2023-05-07 LAB — RAD ONC ARIA SESSION SUMMARY
Course Elapsed Days: 22
Plan Fractions Treated to Date: 17
Plan Prescribed Dose Per Fraction: 1.8 Gy
Plan Total Fractions Prescribed: 25
Plan Total Prescribed Dose: 45 Gy
Reference Point Dosage Given to Date: 30.6 Gy
Reference Point Session Dosage Given: 1.8 Gy
Session Number: 17

## 2023-05-08 ENCOUNTER — Other Ambulatory Visit: Payer: Self-pay

## 2023-05-08 ENCOUNTER — Ambulatory Visit: Admission: RE | Admit: 2023-05-08 | Payer: BC Managed Care – PPO | Source: Ambulatory Visit

## 2023-05-08 DIAGNOSIS — C61 Malignant neoplasm of prostate: Secondary | ICD-10-CM | POA: Diagnosis not present

## 2023-05-08 LAB — RAD ONC ARIA SESSION SUMMARY
Course Elapsed Days: 23
Plan Fractions Treated to Date: 18
Plan Prescribed Dose Per Fraction: 1.8 Gy
Plan Total Fractions Prescribed: 25
Plan Total Prescribed Dose: 45 Gy
Reference Point Dosage Given to Date: 32.4 Gy
Reference Point Session Dosage Given: 1.8 Gy
Session Number: 18

## 2023-05-09 ENCOUNTER — Ambulatory Visit
Admission: RE | Admit: 2023-05-09 | Discharge: 2023-05-09 | Disposition: A | Discharge status: Home / Self Care | Payer: BC Managed Care – PPO | Source: Ambulatory Visit | Attending: Radiation Oncology | Admitting: Radiation Oncology

## 2023-05-09 ENCOUNTER — Other Ambulatory Visit: Payer: Self-pay

## 2023-05-09 DIAGNOSIS — C61 Malignant neoplasm of prostate: Secondary | ICD-10-CM | POA: Diagnosis not present

## 2023-05-09 LAB — RAD ONC ARIA SESSION SUMMARY
Course Elapsed Days: 24
Plan Fractions Treated to Date: 19
Plan Prescribed Dose Per Fraction: 1.8 Gy
Plan Total Fractions Prescribed: 25
Plan Total Prescribed Dose: 45 Gy
Reference Point Dosage Given to Date: 34.2 Gy
Reference Point Session Dosage Given: 1.8 Gy
Session Number: 19

## 2023-05-10 ENCOUNTER — Ambulatory Visit
Admission: RE | Admit: 2023-05-10 | Discharge: 2023-05-10 | Disposition: A | Discharge status: Home / Self Care | Payer: BC Managed Care – PPO | Source: Ambulatory Visit | Attending: Radiation Oncology | Admitting: Radiation Oncology

## 2023-05-10 ENCOUNTER — Other Ambulatory Visit: Payer: Self-pay

## 2023-05-10 DIAGNOSIS — C61 Malignant neoplasm of prostate: Secondary | ICD-10-CM | POA: Diagnosis not present

## 2023-05-10 LAB — RAD ONC ARIA SESSION SUMMARY
Course Elapsed Days: 25
Plan Fractions Treated to Date: 20
Plan Prescribed Dose Per Fraction: 1.8 Gy
Plan Total Fractions Prescribed: 25
Plan Total Prescribed Dose: 45 Gy
Reference Point Dosage Given to Date: 36 Gy
Reference Point Session Dosage Given: 1.8 Gy
Session Number: 20

## 2023-05-14 ENCOUNTER — Ambulatory Visit
Admission: RE | Admit: 2023-05-14 | Discharge: 2023-05-14 | Disposition: A | Discharge status: Home / Self Care | Payer: BC Managed Care – PPO | Source: Ambulatory Visit | Attending: Radiation Oncology | Admitting: Radiation Oncology

## 2023-05-14 ENCOUNTER — Other Ambulatory Visit: Payer: Self-pay

## 2023-05-14 DIAGNOSIS — C61 Malignant neoplasm of prostate: Secondary | ICD-10-CM | POA: Insufficient documentation

## 2023-05-14 LAB — RAD ONC ARIA SESSION SUMMARY
Course Elapsed Days: 29
Plan Fractions Treated to Date: 21
Plan Prescribed Dose Per Fraction: 1.8 Gy
Plan Total Fractions Prescribed: 25
Plan Total Prescribed Dose: 45 Gy
Reference Point Dosage Given to Date: 37.8 Gy
Reference Point Session Dosage Given: 1.8 Gy
Session Number: 21

## 2023-05-15 ENCOUNTER — Other Ambulatory Visit: Payer: Self-pay

## 2023-05-15 ENCOUNTER — Ambulatory Visit
Admission: RE | Admit: 2023-05-15 | Discharge: 2023-05-15 | Disposition: A | Discharge status: Home / Self Care | Payer: BC Managed Care – PPO | Source: Ambulatory Visit | Attending: Radiation Oncology | Admitting: Radiation Oncology

## 2023-05-15 DIAGNOSIS — C61 Malignant neoplasm of prostate: Secondary | ICD-10-CM | POA: Diagnosis not present

## 2023-05-15 LAB — RAD ONC ARIA SESSION SUMMARY
Course Elapsed Days: 30
Plan Fractions Treated to Date: 22
Plan Prescribed Dose Per Fraction: 1.8 Gy
Plan Total Fractions Prescribed: 25
Plan Total Prescribed Dose: 45 Gy
Reference Point Dosage Given to Date: 39.6 Gy
Reference Point Session Dosage Given: 1.8 Gy
Session Number: 22

## 2023-05-16 ENCOUNTER — Ambulatory Visit
Admission: RE | Admit: 2023-05-16 | Discharge: 2023-05-16 | Disposition: A | Discharge status: Home / Self Care | Payer: BC Managed Care – PPO | Source: Ambulatory Visit | Attending: Radiation Oncology | Admitting: Radiation Oncology

## 2023-05-16 ENCOUNTER — Other Ambulatory Visit: Payer: Self-pay

## 2023-05-16 DIAGNOSIS — C61 Malignant neoplasm of prostate: Secondary | ICD-10-CM | POA: Diagnosis not present

## 2023-05-16 LAB — RAD ONC ARIA SESSION SUMMARY
Course Elapsed Days: 31
Plan Fractions Treated to Date: 23
Plan Prescribed Dose Per Fraction: 1.8 Gy
Plan Total Fractions Prescribed: 25
Plan Total Prescribed Dose: 45 Gy
Reference Point Dosage Given to Date: 41.4 Gy
Reference Point Session Dosage Given: 1.8 Gy
Session Number: 23

## 2023-05-17 ENCOUNTER — Other Ambulatory Visit: Payer: Self-pay

## 2023-05-17 ENCOUNTER — Other Ambulatory Visit: Payer: Self-pay | Admitting: Radiation Oncology

## 2023-05-17 ENCOUNTER — Ambulatory Visit
Admission: RE | Admit: 2023-05-17 | Discharge: 2023-05-17 | Disposition: A | Discharge status: Home / Self Care | Payer: BC Managed Care – PPO | Source: Ambulatory Visit | Attending: Radiation Oncology | Admitting: Radiation Oncology

## 2023-05-17 DIAGNOSIS — C61 Malignant neoplasm of prostate: Secondary | ICD-10-CM | POA: Diagnosis not present

## 2023-05-17 LAB — RAD ONC ARIA SESSION SUMMARY
Course Elapsed Days: 32
Plan Fractions Treated to Date: 24
Plan Prescribed Dose Per Fraction: 1.8 Gy
Plan Total Fractions Prescribed: 25
Plan Total Prescribed Dose: 45 Gy
Reference Point Dosage Given to Date: 43.2 Gy
Reference Point Session Dosage Given: 1.8 Gy
Session Number: 24

## 2023-05-17 MED ORDER — TAMSULOSIN HCL 0.4 MG PO CAPS: 0.4000 mg | ORAL_CAPSULE | Freq: Every day | ORAL | 5 refills | Status: AC

## 2023-05-20 ENCOUNTER — Other Ambulatory Visit: Payer: Self-pay

## 2023-05-20 ENCOUNTER — Ambulatory Visit
Admission: RE | Admit: 2023-05-20 | Discharge: 2023-05-20 | Disposition: A | Discharge status: Home / Self Care | Payer: BC Managed Care – PPO | Source: Ambulatory Visit | Attending: Radiation Oncology | Admitting: Radiation Oncology

## 2023-05-20 DIAGNOSIS — C61 Malignant neoplasm of prostate: Secondary | ICD-10-CM | POA: Diagnosis not present

## 2023-05-20 LAB — RAD ONC ARIA SESSION SUMMARY
Course Elapsed Days: 35
Plan Fractions Treated to Date: 25
Plan Prescribed Dose Per Fraction: 1.8 Gy
Plan Total Fractions Prescribed: 25
Plan Total Prescribed Dose: 45 Gy
Reference Point Dosage Given to Date: 45 Gy
Reference Point Session Dosage Given: 1.8 Gy
Session Number: 25

## 2023-05-21 ENCOUNTER — Ambulatory Visit
Admission: RE | Admit: 2023-05-21 | Discharge: 2023-05-21 | Disposition: A | Discharge status: Home / Self Care | Payer: BC Managed Care – PPO | Source: Ambulatory Visit | Attending: Radiation Oncology | Admitting: Radiation Oncology

## 2023-05-21 ENCOUNTER — Other Ambulatory Visit: Payer: Self-pay

## 2023-05-21 DIAGNOSIS — C61 Malignant neoplasm of prostate: Secondary | ICD-10-CM | POA: Diagnosis not present

## 2023-05-21 LAB — RAD ONC ARIA SESSION SUMMARY
Course Elapsed Days: 36
Plan Fractions Treated to Date: 1
Plan Prescribed Dose Per Fraction: 2 Gy
Plan Total Fractions Prescribed: 15
Plan Total Prescribed Dose: 30 Gy
Reference Point Dosage Given to Date: 2 Gy
Reference Point Session Dosage Given: 2 Gy
Session Number: 26

## 2023-05-22 ENCOUNTER — Other Ambulatory Visit: Payer: Self-pay

## 2023-05-22 ENCOUNTER — Ambulatory Visit
Admission: RE | Admit: 2023-05-22 | Discharge: 2023-05-22 | Disposition: A | Discharge status: Home / Self Care | Payer: BC Managed Care – PPO | Source: Ambulatory Visit | Attending: Radiation Oncology

## 2023-05-22 ENCOUNTER — Ambulatory Visit
Admission: RE | Admit: 2023-05-22 | Discharge: 2023-05-22 | Disposition: A | Discharge status: Home / Self Care | Payer: BC Managed Care – PPO | Source: Ambulatory Visit | Attending: Radiation Oncology | Admitting: Radiation Oncology

## 2023-05-22 DIAGNOSIS — C61 Malignant neoplasm of prostate: Secondary | ICD-10-CM | POA: Diagnosis not present

## 2023-05-22 LAB — RAD ONC ARIA SESSION SUMMARY
Course Elapsed Days: 37
Plan Fractions Treated to Date: 2
Plan Prescribed Dose Per Fraction: 2 Gy
Plan Total Fractions Prescribed: 15
Plan Total Prescribed Dose: 30 Gy
Reference Point Dosage Given to Date: 4 Gy
Reference Point Session Dosage Given: 2 Gy
Session Number: 27

## 2023-05-23 ENCOUNTER — Ambulatory Visit
Admission: RE | Admit: 2023-05-23 | Discharge: 2023-05-23 | Disposition: A | Discharge status: Home / Self Care | Payer: BC Managed Care – PPO | Source: Ambulatory Visit | Attending: Radiation Oncology | Admitting: Radiation Oncology

## 2023-05-23 ENCOUNTER — Other Ambulatory Visit: Payer: Self-pay

## 2023-05-23 DIAGNOSIS — C61 Malignant neoplasm of prostate: Secondary | ICD-10-CM | POA: Diagnosis not present

## 2023-05-23 LAB — RAD ONC ARIA SESSION SUMMARY
Course Elapsed Days: 38
Plan Fractions Treated to Date: 3
Plan Prescribed Dose Per Fraction: 2 Gy
Plan Total Fractions Prescribed: 15
Plan Total Prescribed Dose: 30 Gy
Reference Point Dosage Given to Date: 6 Gy
Reference Point Session Dosage Given: 2 Gy
Session Number: 28

## 2023-05-24 ENCOUNTER — Ambulatory Visit
Admission: RE | Admit: 2023-05-24 | Discharge: 2023-05-24 | Disposition: A | Discharge status: Home / Self Care | Payer: BC Managed Care – PPO | Source: Ambulatory Visit | Attending: Radiation Oncology

## 2023-05-24 ENCOUNTER — Other Ambulatory Visit: Payer: Self-pay

## 2023-05-24 DIAGNOSIS — C61 Malignant neoplasm of prostate: Secondary | ICD-10-CM | POA: Diagnosis not present

## 2023-05-24 LAB — RAD ONC ARIA SESSION SUMMARY
Course Elapsed Days: 39
Plan Fractions Treated to Date: 4
Plan Prescribed Dose Per Fraction: 2 Gy
Plan Total Fractions Prescribed: 15
Plan Total Prescribed Dose: 30 Gy
Reference Point Dosage Given to Date: 8 Gy
Reference Point Session Dosage Given: 2 Gy
Session Number: 29

## 2023-05-27 ENCOUNTER — Ambulatory Visit
Admission: RE | Admit: 2023-05-27 | Discharge: 2023-05-27 | Disposition: A | Discharge status: Home / Self Care | Payer: BC Managed Care – PPO | Source: Ambulatory Visit | Attending: Radiation Oncology

## 2023-05-27 ENCOUNTER — Other Ambulatory Visit: Payer: Self-pay

## 2023-05-27 DIAGNOSIS — C61 Malignant neoplasm of prostate: Secondary | ICD-10-CM | POA: Diagnosis not present

## 2023-05-27 LAB — RAD ONC ARIA SESSION SUMMARY
Course Elapsed Days: 42
Plan Fractions Treated to Date: 5
Plan Prescribed Dose Per Fraction: 2 Gy
Plan Total Fractions Prescribed: 15
Plan Total Prescribed Dose: 30 Gy
Reference Point Dosage Given to Date: 10 Gy
Reference Point Session Dosage Given: 2 Gy
Session Number: 30

## 2023-05-28 ENCOUNTER — Ambulatory Visit
Admission: RE | Admit: 2023-05-28 | Discharge: 2023-05-28 | Disposition: A | Discharge status: Home / Self Care | Payer: BC Managed Care – PPO | Source: Ambulatory Visit | Attending: Radiation Oncology

## 2023-05-28 ENCOUNTER — Other Ambulatory Visit: Payer: Self-pay

## 2023-05-28 DIAGNOSIS — C61 Malignant neoplasm of prostate: Secondary | ICD-10-CM | POA: Diagnosis not present

## 2023-05-28 LAB — RAD ONC ARIA SESSION SUMMARY
Course Elapsed Days: 43
Plan Fractions Treated to Date: 6
Plan Prescribed Dose Per Fraction: 2 Gy
Plan Total Fractions Prescribed: 15
Plan Total Prescribed Dose: 30 Gy
Reference Point Dosage Given to Date: 12 Gy
Reference Point Session Dosage Given: 2 Gy
Session Number: 31

## 2023-05-29 ENCOUNTER — Ambulatory Visit
Admission: RE | Admit: 2023-05-29 | Discharge: 2023-05-29 | Disposition: A | Discharge status: Home / Self Care | Payer: BC Managed Care – PPO | Source: Ambulatory Visit | Attending: Radiation Oncology | Admitting: Radiation Oncology

## 2023-05-29 ENCOUNTER — Other Ambulatory Visit: Payer: Self-pay

## 2023-05-29 DIAGNOSIS — C61 Malignant neoplasm of prostate: Secondary | ICD-10-CM | POA: Diagnosis not present

## 2023-05-29 LAB — RAD ONC ARIA SESSION SUMMARY
Course Elapsed Days: 44
Plan Fractions Treated to Date: 7
Plan Prescribed Dose Per Fraction: 2 Gy
Plan Total Fractions Prescribed: 15
Plan Total Prescribed Dose: 30 Gy
Reference Point Dosage Given to Date: 14 Gy
Reference Point Session Dosage Given: 2 Gy
Session Number: 32

## 2023-05-30 ENCOUNTER — Ambulatory Visit
Admission: RE | Admit: 2023-05-30 | Discharge: 2023-05-30 | Disposition: A | Discharge status: Home / Self Care | Payer: BC Managed Care – PPO | Source: Ambulatory Visit | Attending: Radiation Oncology | Admitting: Radiation Oncology

## 2023-05-30 ENCOUNTER — Other Ambulatory Visit: Payer: Self-pay

## 2023-05-30 DIAGNOSIS — C61 Malignant neoplasm of prostate: Secondary | ICD-10-CM | POA: Diagnosis not present

## 2023-05-30 LAB — RAD ONC ARIA SESSION SUMMARY
Course Elapsed Days: 45
Plan Fractions Treated to Date: 8
Plan Prescribed Dose Per Fraction: 2 Gy
Plan Total Fractions Prescribed: 15
Plan Total Prescribed Dose: 30 Gy
Reference Point Dosage Given to Date: 16 Gy
Reference Point Session Dosage Given: 2 Gy
Session Number: 33

## 2023-05-31 ENCOUNTER — Ambulatory Visit
Admission: RE | Admit: 2023-05-31 | Discharge: 2023-05-31 | Disposition: A | Discharge status: Home / Self Care | Payer: BC Managed Care – PPO | Source: Ambulatory Visit | Attending: Radiation Oncology

## 2023-05-31 ENCOUNTER — Other Ambulatory Visit: Payer: Self-pay

## 2023-05-31 DIAGNOSIS — C61 Malignant neoplasm of prostate: Secondary | ICD-10-CM | POA: Diagnosis not present

## 2023-05-31 LAB — RAD ONC ARIA SESSION SUMMARY
Course Elapsed Days: 46
Plan Fractions Treated to Date: 9
Plan Prescribed Dose Per Fraction: 2 Gy
Plan Total Fractions Prescribed: 15
Plan Total Prescribed Dose: 30 Gy
Reference Point Dosage Given to Date: 18 Gy
Reference Point Session Dosage Given: 2 Gy
Session Number: 34

## 2023-06-03 ENCOUNTER — Ambulatory Visit: Payer: BC Managed Care – PPO

## 2023-06-03 ENCOUNTER — Other Ambulatory Visit: Payer: Self-pay

## 2023-06-03 ENCOUNTER — Ambulatory Visit
Admission: RE | Admit: 2023-06-03 | Discharge: 2023-06-03 | Disposition: A | Discharge status: Home / Self Care | Payer: BC Managed Care – PPO | Source: Ambulatory Visit | Attending: Radiation Oncology

## 2023-06-03 DIAGNOSIS — C61 Malignant neoplasm of prostate: Secondary | ICD-10-CM | POA: Diagnosis not present

## 2023-06-03 LAB — RAD ONC ARIA SESSION SUMMARY
Course Elapsed Days: 49
Plan Fractions Treated to Date: 10
Plan Prescribed Dose Per Fraction: 2 Gy
Plan Total Fractions Prescribed: 15
Plan Total Prescribed Dose: 30 Gy
Reference Point Dosage Given to Date: 20 Gy
Reference Point Session Dosage Given: 2 Gy
Session Number: 35

## 2023-06-04 ENCOUNTER — Other Ambulatory Visit: Payer: Self-pay

## 2023-06-04 ENCOUNTER — Ambulatory Visit
Admission: RE | Admit: 2023-06-04 | Discharge: 2023-06-04 | Disposition: A | Discharge status: Home / Self Care | Payer: BC Managed Care – PPO | Source: Ambulatory Visit | Attending: Radiation Oncology

## 2023-06-04 DIAGNOSIS — C61 Malignant neoplasm of prostate: Secondary | ICD-10-CM | POA: Diagnosis not present

## 2023-06-04 LAB — RAD ONC ARIA SESSION SUMMARY
Course Elapsed Days: 50
Plan Fractions Treated to Date: 11
Plan Prescribed Dose Per Fraction: 2 Gy
Plan Total Fractions Prescribed: 15
Plan Total Prescribed Dose: 30 Gy
Reference Point Dosage Given to Date: 22 Gy
Reference Point Session Dosage Given: 2 Gy
Session Number: 36

## 2023-06-05 ENCOUNTER — Ambulatory Visit: Payer: BC Managed Care – PPO

## 2023-06-06 ENCOUNTER — Other Ambulatory Visit: Payer: Self-pay

## 2023-06-06 ENCOUNTER — Ambulatory Visit
Admission: RE | Admit: 2023-06-06 | Discharge: 2023-06-06 | Disposition: A | Discharge status: Home / Self Care | Payer: BC Managed Care – PPO | Source: Ambulatory Visit | Attending: Radiation Oncology | Admitting: Radiation Oncology

## 2023-06-06 DIAGNOSIS — C61 Malignant neoplasm of prostate: Secondary | ICD-10-CM | POA: Diagnosis not present

## 2023-06-06 LAB — RAD ONC ARIA SESSION SUMMARY
Course Elapsed Days: 52
Plan Fractions Treated to Date: 12
Plan Prescribed Dose Per Fraction: 2 Gy
Plan Total Fractions Prescribed: 15
Plan Total Prescribed Dose: 30 Gy
Reference Point Dosage Given to Date: 24 Gy
Reference Point Session Dosage Given: 2 Gy
Session Number: 37

## 2023-06-07 ENCOUNTER — Other Ambulatory Visit: Payer: Self-pay

## 2023-06-07 ENCOUNTER — Ambulatory Visit
Admission: RE | Admit: 2023-06-07 | Discharge: 2023-06-07 | Disposition: A | Discharge status: Home / Self Care | Payer: BC Managed Care – PPO | Source: Ambulatory Visit | Attending: Radiation Oncology

## 2023-06-07 ENCOUNTER — Ambulatory Visit
Admission: RE | Admit: 2023-06-07 | Discharge: 2023-06-07 | Disposition: A | Discharge status: Home / Self Care | Payer: BC Managed Care – PPO | Source: Ambulatory Visit | Attending: Radiation Oncology | Admitting: Radiation Oncology

## 2023-06-07 ENCOUNTER — Ambulatory Visit: Payer: BC Managed Care – PPO

## 2023-06-07 DIAGNOSIS — C61 Malignant neoplasm of prostate: Secondary | ICD-10-CM | POA: Diagnosis not present

## 2023-06-07 LAB — RAD ONC ARIA SESSION SUMMARY
Course Elapsed Days: 53
Plan Fractions Treated to Date: 13
Plan Prescribed Dose Per Fraction: 2 Gy
Plan Total Fractions Prescribed: 15
Plan Total Prescribed Dose: 30 Gy
Reference Point Dosage Given to Date: 26 Gy
Reference Point Session Dosage Given: 2 Gy
Session Number: 38

## 2023-06-10 ENCOUNTER — Ambulatory Visit
Admission: RE | Admit: 2023-06-10 | Discharge: 2023-06-10 | Disposition: A | Discharge status: Home / Self Care | Payer: BC Managed Care – PPO | Source: Ambulatory Visit | Attending: Radiation Oncology | Admitting: Radiation Oncology

## 2023-06-10 ENCOUNTER — Ambulatory Visit: Payer: BC Managed Care – PPO

## 2023-06-10 ENCOUNTER — Other Ambulatory Visit: Payer: Self-pay

## 2023-06-10 DIAGNOSIS — C61 Malignant neoplasm of prostate: Secondary | ICD-10-CM | POA: Diagnosis not present

## 2023-06-10 LAB — RAD ONC ARIA SESSION SUMMARY
Course Elapsed Days: 56
Plan Fractions Treated to Date: 14
Plan Prescribed Dose Per Fraction: 2 Gy
Plan Total Fractions Prescribed: 15
Plan Total Prescribed Dose: 30 Gy
Reference Point Dosage Given to Date: 28 Gy
Reference Point Session Dosage Given: 2 Gy
Session Number: 39

## 2023-06-11 ENCOUNTER — Other Ambulatory Visit: Payer: Self-pay

## 2023-06-11 ENCOUNTER — Ambulatory Visit: Payer: BC Managed Care – PPO

## 2023-06-11 ENCOUNTER — Ambulatory Visit
Admission: RE | Admit: 2023-06-11 | Discharge: 2023-06-11 | Disposition: A | Discharge status: Home / Self Care | Payer: BC Managed Care – PPO | Source: Ambulatory Visit | Attending: Radiation Oncology | Admitting: Radiation Oncology

## 2023-06-11 DIAGNOSIS — C61 Malignant neoplasm of prostate: Secondary | ICD-10-CM | POA: Insufficient documentation

## 2023-06-11 LAB — RAD ONC ARIA SESSION SUMMARY
Course Elapsed Days: 57
Plan Fractions Treated to Date: 15
Plan Prescribed Dose Per Fraction: 2 Gy
Plan Total Fractions Prescribed: 15
Plan Total Prescribed Dose: 30 Gy
Reference Point Dosage Given to Date: 30 Gy
Reference Point Session Dosage Given: 2 Gy
Session Number: 40

## 2023-06-12 NOTE — Radiation Completion Notes (Addendum)
Radiation Oncology         [phone] ________________________________  Name: Glenn Burns MRN: [id number]  Date: 06/11/2023  DOB: 09-Jul-1956   Referring Physician: Bjorn Pippin, M.D. Date of Service: 2023-06-12 Radiation Oncologist: Margaretmary Bayley, M.D. [facility]     RADIATION ONCOLOGY END OF TREATMENT NOTE     Diagnosis: 67 y.o. gentleman with stage T1c adenocarcinoma of the prostate with a Gleason's score of 4+4 and a PSA of 12.11   Intent: Curative     ==========DELIVERED PLANS==========  First Treatment Date: 2023-04-15 - Last Treatment Date: 2023-06-11   Plan Name: Prostate_Pelv Site: Prostate Technique: IMRT Mode: Photon Dose Per Fraction: 1.8 Gy Prescribed Dose (Delivered / Prescribed): 45 Gy / 45 Gy Prescribed Fxs (Delivered / Prescribed): 25 / 25   Plan Name: Prostate_Bst Site: Prostate Technique: IMRT Mode: Photon Dose Per Fraction: 2 Gy Prescribed Dose (Delivered / Prescribed): 30 Gy / 30 Gy Prescribed Fxs (Delivered / Prescribed): 15 / 15     ==========ON TREATMENT VISIT DATES========== 2023-04-18, 2023-04-26, 2023-05-03, 2023-05-10, 2023-05-17, 2023-05-22, 2023-05-31, 2023-06-07    See weekly On Treatment Notes in Epic for details.  He tolerated the radiation treatments relatively well with only minor urinary symptoms and modest fatigue.  His LUTS were managed with Flomax daily.  The patient will receive a call in about one month from the radiation oncology department. He will continue follow up with his urologist, Dr. Annabell Howells, as well.  ------------------------------------------------   Margaretmary Dys, MD [facility]  Radiation Oncology Direct Dial: [phone]  Fax: [phone] [email or web address]  Skype  [email or web address]

## 2023-06-21 NOTE — Progress Notes (Signed)
Patient was presented to the [facility] on 01/01/23 for his stage T1c adenocarcinoma of the prostate with a Gleason's score of 4+4 and a PSA of 12.11.  Patient proceed with treatment recommendations of LT ADT concurrent with 8 weeks of daily external beam radiation and had his final radiation treatment on 10/1/.   Patient is scheduled for a post treatment nurse call on 07/23/23 and has his first post treatment PSA on 06/17/23 at [facility].   RN spoke with patient and provided education on post treatment PSA monitoring.  No additional needs at this time.

## 2023-07-05 ENCOUNTER — Other Ambulatory Visit: Payer: Self-pay | Admitting: Urology

## 2023-07-05 DIAGNOSIS — C61 Malignant neoplasm of prostate: Secondary | ICD-10-CM

## 2023-07-18 ENCOUNTER — Encounter: Payer: Self-pay | Admitting: *Deleted

## 2023-07-23 ENCOUNTER — Ambulatory Visit
Admission: RE | Admit: 2023-07-23 | Discharge: 2023-07-23 | Disposition: A | Discharge status: Home / Self Care | Payer: BC Managed Care – PPO | Source: Ambulatory Visit | Attending: Radiation Oncology | Admitting: Radiation Oncology

## 2023-07-23 NOTE — Progress Notes (Signed)
  Radiation Oncology         [phone] ________________________________  Name: Glenn Burns MRN: [id number]  Date of Service: 07/23/2023  DOB: 1956-04-05  Post Treatment Telephone Note  Diagnosis:  stage T1c adenocarcinoma of the prostate with a Gleason's score of 4+4 and a PSA of 12.11 (as documented in provider EOT note)  Pre Treatment IPSS Score: 9 (as documented in the provider consult note)  The patient was available for call today.   Symptoms of fatigue have improved since completing therapy.  Symptoms of bladder changes have improved since completing therapy. Current symptoms include none, and medications for bladder symptoms include Tamsulosin.  Symptoms of bowel changes have improved since completing therapy. Current symptoms include none, and medications for bowel symptoms include none.   Post Treatment IPSS Score: IPSS Questionnaire (AUA-7): Over the past month.   1)  How often have you had a sensation of not emptying your bladder completely after you finish urinating?  0 - Not at all  2)  How often have you had to urinate again less than two hours after you finished urinating? 0 - Not at all  3)  How often have you found you stopped and started again several times when you urinated?  0 - Not at all  4) How difficult have you found it to postpone urination?  0 - Not at all  5) How often have you had a weak urinary stream?  1 - Less than 1 time in 5  6) How often have you had to push or strain to begin urination?  0 - Not at all  7) How many times did you most typically get up to urinate from the time you went to bed until the time you got up in the morning?  2 - 2 times  Total score:  3. Which indicates mild symptoms  0-7 mildly symptomatic   8-19 moderately symptomatic   20-35 severely symptomatic    Patient has a scheduled follow up visit with his urologist, Dr. Annabell Howells on 11/2023 for ongoing surveillance. He was counseled that PSA levels will be drawn in the  urology office, and was reassured that additional time is expected to improve bowel and bladder symptoms. He was encouraged to call back with concerns or questions regarding radiation.   This concludes the interaction.  Ruel Favors, LPN

## 2023-08-30 ENCOUNTER — Encounter: Payer: Self-pay | Admitting: *Deleted

## 2023-08-30 ENCOUNTER — Inpatient Hospital Stay: Payer: BC Managed Care – PPO | Attending: Adult Health | Admitting: *Deleted

## 2023-08-30 DIAGNOSIS — C61 Malignant neoplasm of prostate: Secondary | ICD-10-CM

## 2023-08-30 NOTE — Progress Notes (Signed)
SCP reviewed and completed. Most recent PSA was 0.111 on 06/17/2023 at [facility]. Pt will return in April at [facility] for PSA labs. Last colonoscopy was 08/13/2017. Next colonoscopy due 08/2027. Pt recently stated he  had Covid booster and Flu vaccine a week ago at clinic at place of employment. Faxed PCP a copy of treatment summary today.

## 2024-03-10 ENCOUNTER — Ambulatory Visit: Payer: Self-pay | Admitting: General Surgery

## 2024-03-10 NOTE — H&P (Signed)
 History of Present Illness Glenn Burns is a 68 year old male with a history of prostate cancer who presents with bilateral inguinal hernia referred by Dr. Cleotilde.  He began experiencing a bulge and pain in his left groin area approximately three weeks ago while lying in bed. The bulge was painful, and he initially thought it might be related to gas. The bulge and pain subsided by the next morning, and the bulge disappeared when he lay down. No pain is reported on the right side.  A PET scan on December 21, 2022, incidentally revealed bilateral inguinal hernias, with the left side being larger than the right. He has a history of a previous hernia repair at the umbilicus, which was performed by a doctor who is no longer available.  He has a history of prostate cancer, which was treated with radiation therapy, and his prostate-specific antigen (PSA) level is currently 0.025. He experienced hematuria about a month ago, which prompted a visit to a urologist. However, subsequent urine tests did not show blood. He has nocturia and is scheduled to see his urologist for a follow-up visit.  His brother had prostate cancer in 2014, which recurred in 2019 and was fatal.      PAST MEDICAL HISTORY:  Past Medical History:  Diagnosis Date   Allergic rhinitis    Allergy    BPH (benign prostatic hyperplasia)    Diverticulosis    HLD (hyperlipidemia)    HTN (hypertension)    Internal hemorrhoids    diverticulosis noted on colonoscopy   Prostate cancer (CMS/HHS-HCC)    Per patient.        PAST SURGICAL HISTORY:   Past Surgical History:  Procedure Laterality Date   COLONOSCOPY  06/23/2007   Int Hemorrhoids, Diverticulosis: [facility] 06/2017; Recall Ltr mailed 05/23/2017 (dw)   COLONOSCOPY  08/13/2017   Int Hemorrhoids: [facility] 08/2027   HERNIA REPAIR     UMBILICAL HERNIA REPAIR     [facility]         MEDICATIONS:  Outpatient Encounter Medications as of 03/10/2024  Medication Sig Dispense Refill   amLODIPine  (NORVASC) 5 MG tablet Take 1 tablet (5 mg total) by mouth at bedtime 90 tablet 3   multivitamin tablet Take 1 tablet by mouth once daily.     olmesartan (BENICAR) 40 MG tablet Take 1 tablet (40 mg total) by mouth once daily 90 tablet 3   ORGOVYX 120 mg tablet Take 120 mg by mouth once daily     potassium chloride (KLOR-CON) 8 MEQ ER tablet Take 1 tablet (8 mEq total) by mouth once daily 90 tablet 3   saw palmetto 160 MG capsule Take 160 mg by mouth 2 (two) times daily As needed Takes one daily     simvastatin (ZOCOR) 40 MG tablet Take 1 tablet (40 mg total) by mouth every evening 90 tablet 3   tadalafiL (CIALIS) 5 MG tablet Take 1 tablet (5 mg total) by mouth once daily 90 tablet 3   triamterene-hydroCHLOROthiazide (MAXZIDE-25) 37.5-25 mg tablet Take 1 tablet by mouth once daily 90 tablet 3   No facility-administered encounter medications on file as of 03/10/2024.     ALLERGIES:   Patient has no known allergies.   SOCIAL HISTORY:  Social History   Socioeconomic History   Marital status: Married    Spouse name: Santa   Number of children: 4   Years of education: 16   Highest education level: Bachelor's degree (e.g., BA, AB, BS)  Occupational History  Occupation: Environmental education officer - Personnel officer  Tobacco Use   Smoking status: Never   Smokeless tobacco: Never  Vaping Use   Vaping status: Never Used  Substance and Sexual Activity   Alcohol use: No    Alcohol/week: 0.0 standard drinks of alcohol   Drug use: Never   Sexual activity: Yes    Partners: Female    Birth control/protection: None  Social History Narrative   Works full time.   Social Drivers of Corporate investment banker Strain: Low Risk  (10/30/2023)   Overall Financial Resource Strain (CARDIA)    Difficulty of Paying Living Expenses: Not hard at all  Food Insecurity: No Food Insecurity (10/30/2023)   Hunger Vital Sign    Worried About Running Out of Food in the Last Year: Never true    Ran Out of Food in the Last Year:  Never true  Transportation Needs: No Transportation Needs (10/30/2023)   PRAPARE - Administrator, Civil Service (Medical): No    Lack of Transportation (Non-Medical): No    FAMILY HISTORY:  Family History  Problem Relation Name Age of Onset   Hypotension Mother     High blood pressure (Hypertension) Father Jerrell    Allergies Father Jerrell    Ulcers Sister         ulcerative stomach     GENERAL REVIEW OF SYSTEMS:   General ROS: negative for - chills, fatigue, fever, weight gain or weight loss Allergy and Immunology ROS: negative for - hives  Hematological and Lymphatic ROS: negative for - bleeding problems or bruising, negative for palpable nodes Endocrine ROS: negative for - heat or cold intolerance, hair changes Respiratory ROS: negative for - cough, shortness of breath or wheezing Cardiovascular ROS: no chest pain or palpitations GI ROS: negative for nausea, vomiting, abdominal pain, diarrhea, constipation Musculoskeletal ROS: negative for - joint swelling or muscle pain Neurological ROS: negative for - confusion, syncope Dermatological ROS: negative for pruritus and rash  PHYSICAL EXAM:  Vitals:   03/10/24 1039  BP: (!) 140/98  Pulse: 71  .  Ht:180.3 cm (5' 10.98) Wt:94.9 kg (209 lb 3.5 oz) ADJ:Anib surface area is 2.18 meters squared. Body mass index is 29.19 kg/m.SABRA   GENERAL: Alert, active, oriented x3  HEENT: Pupils equal reactive to light. Extraocular movements are intact. Sclera clear. Palpebral conjunctiva normal red color.Pharynx clear.  NECK: Supple with no palpable mass and no adenopathy.  LUNGS: Sound clear with no rales rhonchi or wheezes.  HEART: Regular rhythm S1 and S2 without murmur.  ABDOMEN: Soft and depressible, nontender with no palpable mass, no hepatomegaly.  Bunion of the left groin.  Mild tenderness to palpation in the right groin.  EXTREMITIES: Well-developed well-nourished symmetrical with no dependent  edema.  NEUROLOGICAL: Awake alert oriented, facial expression symmetrical, moving all extremities.   Results LABS PSA: 0.025  RADIOLOGY PET scan: Bilateral inguinal hernia with left side larger than right side (12/21/2022)    Assessment & Plan Bilateral inguinal hernia   A PET scan from April 2024 identified bilateral inguinal hernia, with the left side larger than the right. Symptoms include a bulge and discomfort on the left side, which disappears when lying down. A CT scan from a year ago showed no intestinal involvement. The hernia causes discomfort and poses a risk of incarceration or strangulation, though this is less common. Schedule laparoscopic hernia repair surgery in a couple of weeks with same-day discharge. Recovery is expected to take approximately two weeks. He should avoid  heavy lifting for two weeks post-surgery. Discuss the potential for swelling and bruising in the groin and testicles post-surgery, which typically resolves within two weeks. Consider repairing the right side hernia during surgery if a defect is observed.  Prostate cancer   Prostate cancer is in remission following radiation treatment, with a PSA level of 0.025. There are recent concerns about hematuria, but no current evidence of recurrence. Urinary issues at night are not expected to improve with hernia repair. Follow up with a urologist for ongoing management of prostate cancer and discuss the upcoming hernia surgery. Ensure the urologist is aware of any necessary interventions related to prostate cancer before proceeding with hernia surgery.   Non-recurrent bilateral inguinal hernia without obstruction or gangrene [K40.20]          Patient and his wife verbalized understanding, all questions were answered, and were agreeable with the plan outlined above.   Lucas Sjogren, MD

## 2024-03-10 NOTE — H&P (View-Only) (Signed)
 History of Present Illness Glenn Burns is a 68 year old male with a history of prostate cancer who presents with bilateral inguinal hernia referred by Dr. Cleotilde.  He began experiencing a bulge and pain in his left groin area approximately three weeks ago while lying in bed. The bulge was painful, and he initially thought it might be related to gas. The bulge and pain subsided by the next morning, and the bulge disappeared when he lay down. No pain is reported on the right side.  A PET scan on December 21, 2022, incidentally revealed bilateral inguinal hernias, with the left side being larger than the right. He has a history of a previous hernia repair at the umbilicus, which was performed by a doctor who is no longer available.  He has a history of prostate cancer, which was treated with radiation therapy, and his prostate-specific antigen (PSA) level is currently 0.025. He experienced hematuria about a month ago, which prompted a visit to a urologist. However, subsequent urine tests did not show blood. He has nocturia and is scheduled to see his urologist for a follow-up visit.  His brother had prostate cancer in 2014, which recurred in 2019 and was fatal.      PAST MEDICAL HISTORY:  Past Medical History:  Diagnosis Date   Allergic rhinitis    Allergy    BPH (benign prostatic hyperplasia)    Diverticulosis    HLD (hyperlipidemia)    HTN (hypertension)    Internal hemorrhoids    diverticulosis noted on colonoscopy   Prostate cancer (CMS/HHS-HCC)    Per patient.        PAST SURGICAL HISTORY:   Past Surgical History:  Procedure Laterality Date   COLONOSCOPY  06/23/2007   Int Hemorrhoids, Diverticulosis: [facility] 06/2017; Recall Ltr mailed 05/23/2017 (dw)   COLONOSCOPY  08/13/2017   Int Hemorrhoids: [facility] 08/2027   HERNIA REPAIR     UMBILICAL HERNIA REPAIR     [facility]         MEDICATIONS:  Outpatient Encounter Medications as of 03/10/2024  Medication Sig Dispense Refill   amLODIPine  (NORVASC) 5 MG tablet Take 1 tablet (5 mg total) by mouth at bedtime 90 tablet 3   multivitamin tablet Take 1 tablet by mouth once daily.     olmesartan (BENICAR) 40 MG tablet Take 1 tablet (40 mg total) by mouth once daily 90 tablet 3   ORGOVYX 120 mg tablet Take 120 mg by mouth once daily     potassium chloride (KLOR-CON) 8 MEQ ER tablet Take 1 tablet (8 mEq total) by mouth once daily 90 tablet 3   saw palmetto 160 MG capsule Take 160 mg by mouth 2 (two) times daily As needed Takes one daily     simvastatin (ZOCOR) 40 MG tablet Take 1 tablet (40 mg total) by mouth every evening 90 tablet 3   tadalafiL (CIALIS) 5 MG tablet Take 1 tablet (5 mg total) by mouth once daily 90 tablet 3   triamterene-hydroCHLOROthiazide (MAXZIDE-25) 37.5-25 mg tablet Take 1 tablet by mouth once daily 90 tablet 3   No facility-administered encounter medications on file as of 03/10/2024.     ALLERGIES:   Patient has no known allergies.   SOCIAL HISTORY:  Social History   Socioeconomic History   Marital status: Married    Spouse name: Santa   Number of children: 4   Years of education: 16   Highest education level: Bachelor's degree (e.g., BA, AB, BS)  Occupational History  Occupation: Environmental education officer - Personnel officer  Tobacco Use   Smoking status: Never   Smokeless tobacco: Never  Vaping Use   Vaping status: Never Used  Substance and Sexual Activity   Alcohol use: No    Alcohol/week: 0.0 standard drinks of alcohol   Drug use: Never   Sexual activity: Yes    Partners: Female    Birth control/protection: None  Social History Narrative   Works full time.   Social Drivers of Corporate investment banker Strain: Low Risk  (10/30/2023)   Overall Financial Resource Strain (CARDIA)    Difficulty of Paying Living Expenses: Not hard at all  Food Insecurity: No Food Insecurity (10/30/2023)   Hunger Vital Sign    Worried About Running Out of Food in the Last Year: Never true    Ran Out of Food in the Last Year:  Never true  Transportation Needs: No Transportation Needs (10/30/2023)   PRAPARE - Administrator, Civil Service (Medical): No    Lack of Transportation (Non-Medical): No    FAMILY HISTORY:  Family History  Problem Relation Name Age of Onset   Hypotension Mother     High blood pressure (Hypertension) Father Jerrell    Allergies Father Jerrell    Ulcers Sister         ulcerative stomach     GENERAL REVIEW OF SYSTEMS:   General ROS: negative for - chills, fatigue, fever, weight gain or weight loss Allergy and Immunology ROS: negative for - hives  Hematological and Lymphatic ROS: negative for - bleeding problems or bruising, negative for palpable nodes Endocrine ROS: negative for - heat or cold intolerance, hair changes Respiratory ROS: negative for - cough, shortness of breath or wheezing Cardiovascular ROS: no chest pain or palpitations GI ROS: negative for nausea, vomiting, abdominal pain, diarrhea, constipation Musculoskeletal ROS: negative for - joint swelling or muscle pain Neurological ROS: negative for - confusion, syncope Dermatological ROS: negative for pruritus and rash  PHYSICAL EXAM:  Vitals:   03/10/24 1039  BP: (!) 140/98  Pulse: 71  .  Ht:180.3 cm (5' 10.98) Wt:94.9 kg (209 lb 3.5 oz) ADJ:Anib surface area is 2.18 meters squared. Body mass index is 29.19 kg/m.SABRA   GENERAL: Alert, active, oriented x3  HEENT: Pupils equal reactive to light. Extraocular movements are intact. Sclera clear. Palpebral conjunctiva normal red color.Pharynx clear.  NECK: Supple with no palpable mass and no adenopathy.  LUNGS: Sound clear with no rales rhonchi or wheezes.  HEART: Regular rhythm S1 and S2 without murmur.  ABDOMEN: Soft and depressible, nontender with no palpable mass, no hepatomegaly.  Bunion of the left groin.  Mild tenderness to palpation in the right groin.  EXTREMITIES: Well-developed well-nourished symmetrical with no dependent  edema.  NEUROLOGICAL: Awake alert oriented, facial expression symmetrical, moving all extremities.   Results LABS PSA: 0.025  RADIOLOGY PET scan: Bilateral inguinal hernia with left side larger than right side (12/21/2022)    Assessment & Plan Bilateral inguinal hernia   A PET scan from April 2024 identified bilateral inguinal hernia, with the left side larger than the right. Symptoms include a bulge and discomfort on the left side, which disappears when lying down. A CT scan from a year ago showed no intestinal involvement. The hernia causes discomfort and poses a risk of incarceration or strangulation, though this is less common. Schedule laparoscopic hernia repair surgery in a couple of weeks with same-day discharge. Recovery is expected to take approximately two weeks. He should avoid  heavy lifting for two weeks post-surgery. Discuss the potential for swelling and bruising in the groin and testicles post-surgery, which typically resolves within two weeks. Consider repairing the right side hernia during surgery if a defect is observed.  Prostate cancer   Prostate cancer is in remission following radiation treatment, with a PSA level of 0.025. There are recent concerns about hematuria, but no current evidence of recurrence. Urinary issues at night are not expected to improve with hernia repair. Follow up with a urologist for ongoing management of prostate cancer and discuss the upcoming hernia surgery. Ensure the urologist is aware of any necessary interventions related to prostate cancer before proceeding with hernia surgery.   Non-recurrent bilateral inguinal hernia without obstruction or gangrene [K40.20]          Patient and his wife verbalized understanding, all questions were answered, and were agreeable with the plan outlined above.   Lucas Sjogren, MD

## 2024-03-20 ENCOUNTER — Other Ambulatory Visit: Payer: Self-pay

## 2024-03-20 ENCOUNTER — Encounter
Admission: RE | Admit: 2024-03-20 | Discharge: 2024-03-20 | Disposition: A | Discharge status: Home / Self Care | Payer: Self-pay | Source: Ambulatory Visit | Attending: General Surgery | Admitting: General Surgery

## 2024-03-20 DIAGNOSIS — I10 Essential (primary) hypertension: Secondary | ICD-10-CM

## 2024-03-20 HISTORY — DX: Bilateral inguinal hernia, without obstruction or gangrene, not specified as recurrent: K40.20

## 2024-03-20 HISTORY — DX: Cardiac murmur, unspecified: R01.1

## 2024-03-20 NOTE — Patient Instructions (Addendum)
 Your procedure is scheduled on: 03/27/24 - Friday Report to the Registration Desk on the 1st floor of the [facility]. To find out your arrival time, please call [phone] between 1PM - 3PM on: 03/26/24 - Thursday If your arrival time is 6:00 am, do not arrive before that time as the [facility] entrance doors do not open until 6:00 am.  REMEMBER: Instructions that are not followed completely may result in serious medical risk, up to and including death; or upon the discretion of your surgeon and anesthesiologist your surgery may need to be rescheduled.  Do not eat food or drink any liquids after midnight the night before surgery.  No gum chewing or hard candies.  One week prior to surgery: Stop Anti-inflammatories (NSAIDS) such as Advil, Aleve, Ibuprofen, Motrin, Naproxen, Naprosyn and Aspirin based products such as Excedrin, Goody's Powder, BC Powder. You may take Tylenol  if needed for pain up until the day of surgery.  Stop ANY OVER THE COUNTER supplements until after surgery : GERITOL COMPLETE  Continue taking all of your other prescription medications up until the day before surgery.  ON THE DAY OF SURGERY ONLY TAKE THESE MEDICATIONS WITH SIPS OF WATER:  none   No Alcohol for 24 hours before or after surgery.  No Smoking including e-cigarettes for 24 hours before surgery.  No chewable tobacco products for at least 6 hours before surgery.  No nicotine patches on the day of surgery.  Do not use any recreational drugs for at least a week (preferably 2 weeks) before your surgery.  Please be advised that the combination of cocaine and anesthesia may have negative outcomes, up to and including death. If you test positive for cocaine, your surgery will be cancelled.  On the morning of surgery brush your teeth with toothpaste and water, you may rinse your mouth with mouthwash if you wish. Do not swallow any toothpaste or mouthwash.  Use CHG Soap or wipes as directed on  instruction sheet.  Do not wear jewelry, make-up, hairpins, clips or nail polish.  For welded (permanent) jewelry: bracelets, anklets, waist bands, etc.  Please have this removed prior to surgery.  If it is not removed, there is a chance that hospital personnel will need to cut it off on the day of surgery.  Do not wear lotions, powders, or perfumes.   Do not shave body hair from the neck down 48 hours before surgery.  Contact lenses, hearing aids and dentures may not be worn into surgery.  Do not bring valuables to the hospital. [facility] is not responsible for any missing/lost belongings or valuables.   Notify your doctor if there is any change in your medical condition (cold, fever, infection).  Wear comfortable clothing (specific to your surgery type) to the hospital.  After surgery, you can help prevent lung complications by doing breathing exercises.  Take deep breaths and cough every 1-2 hours. Your doctor may order a device called an Incentive Spirometer to help you take deep breaths.  When coughing or sneezing, hold a pillow firmly against your incision with both hands. This is called "splinting." Doing this helps protect your incision. It also decreases belly discomfort.  If you are being admitted to the hospital overnight, leave your suitcase in the car. After surgery it may be brought to your room.  In case of increased patient census, it may be necessary for you, the patient, to continue your postoperative care in the Same Day Surgery department.  If you are  being discharged the day of surgery, you will not be allowed to drive home. You will need a responsible individual to drive you home and stay with you for 24 hours after surgery.   If you are taking public transportation, you will need to have a responsible individual with you.  Please call the Pre-admissions Testing Dept. at [phone] if you have any questions about these instructions.  Surgery Visitation  Policy:  Patients having surgery or a procedure may have two visitors.  Children under the age of 49 must have an adult with them who is not the patient.  Inpatient Visitation:    Visiting hours are 7 a.m. to 8 p.m. Up to four visitors are allowed at one time in a patient room. The visitors may rotate out with other people during the day.  One visitor age 51 or older may stay with the patient overnight and must be in the room by 8 p.m.   Merchandiser, retail to address health-related social needs:  [email or web address]     Preparing for Surgery with CHLORHEXIDINE  GLUCONATE (CHG) Soap  Chlorhexidine  Gluconate (CHG) Soap  o An antiseptic cleaner that kills germs and bonds with the skin to continue killing germs even after washing  o Used for showering the night before surgery and morning of surgery  Before surgery, you can play an important role by reducing the number of germs on your skin.  CHG (Chlorhexidine  gluconate) soap is an antiseptic cleanser which kills germs and bonds with the skin to continue killing germs even after washing.  Please do not use if you have an allergy to CHG or antibacterial soaps. If your skin becomes reddened/irritated stop using the CHG.  1. Shower the NIGHT BEFORE SURGERY and the MORNING OF SURGERY with CHG soap.  2. If you choose to wash your hair, wash your hair first as usual with your normal shampoo.  3. After shampooing, rinse your hair and body thoroughly to remove the shampoo.  4. Use CHG as you would any other liquid soap. You can apply CHG directly to the skin and wash gently with a scrungie or a clean washcloth.  5. Apply the CHG soap to your body only from the neck down. Do not use on open wounds or open sores. Avoid contact with your eyes, ears, mouth, and genitals (private parts). Wash face and genitals (private parts) with your normal soap.  6. Wash thoroughly, paying special attention to the area where your  surgery will be performed.  7. Thoroughly rinse your body with warm water.  8. Do not shower/wash with your normal soap after using and rinsing off the CHG soap.  9. Pat yourself dry with a clean towel.  10. Wear clean pajamas to bed the night before surgery.  12. Place clean sheets on your bed the night of your first shower and do not sleep with pets.  13. Shower again with the CHG soap on the day of surgery prior to arriving at the hospital.  14. Do not apply any deodorants/lotions/powders.  15. Please wear clean clothes to the hospital.

## 2024-03-23 ENCOUNTER — Encounter
Admission: RE | Admit: 2024-03-23 | Discharge: 2024-03-23 | Disposition: A | Discharge status: Home / Self Care | Payer: Self-pay | Source: Ambulatory Visit | Attending: General Surgery | Admitting: General Surgery

## 2024-03-23 DIAGNOSIS — I10 Essential (primary) hypertension: Secondary | ICD-10-CM | POA: Insufficient documentation

## 2024-03-23 DIAGNOSIS — Z01818 Encounter for other preprocedural examination: Secondary | ICD-10-CM | POA: Diagnosis present

## 2024-03-23 LAB — CBC
HCT: 39.1 % (ref 39.0–52.0)
Hemoglobin: 12.4 g/dL — ABNORMAL LOW (ref 13.0–17.0)
MCH: 26.2 pg (ref 26.0–34.0)
MCHC: 31.7 g/dL (ref 30.0–36.0)
MCV: 82.5 fL (ref 80.0–100.0)
Platelets: 223 K/uL (ref 150–400)
RBC: 4.74 MIL/uL (ref 4.22–5.81)
RDW: 14.3 % (ref 11.5–15.5)
WBC: 3.2 K/uL — ABNORMAL LOW (ref 4.0–10.5)
nRBC: 0 % (ref 0.0–0.2)

## 2024-03-23 LAB — BASIC METABOLIC PANEL WITH GFR
Anion gap: 10 (ref 5–15)
BUN: 18 mg/dL (ref 8–23)
CO2: 26 mmol/L (ref 22–32)
Calcium: 9.4 mg/dL (ref 8.9–10.3)
Chloride: 105 mmol/L (ref 98–111)
Creatinine, Ser: 0.78 mg/dL (ref 0.61–1.24)
GFR, Estimated: >60 mL/min (ref >60)
Glucose, Bld: 78 mg/dL (ref 70–99)
Potassium: 3.3 mmol/L — ABNORMAL LOW (ref 3.5–5.1)
Sodium: 141 mmol/L (ref 135–145)

## 2024-03-26 MED ORDER — ORAL CARE MOUTH RINSE: 15.0000 mL | Freq: Once | OROMUCOSAL | Status: AC

## 2024-03-26 MED ORDER — CEFAZOLIN SODIUM-DEXTROSE 2-4 GM/100ML-% IV SOLN
2.0000 g | INTRAVENOUS | Status: AC
  Administered 2024-03-27: 2 g via INTRAVENOUS

## 2024-03-26 MED ORDER — BUPIVACAINE LIPOSOME 1.3 % IJ SUSP: 20.0000 mL | Freq: Once | INTRAMUSCULAR | Status: DC

## 2024-03-26 MED ORDER — ACETAMINOPHEN 500 MG PO TABS
1000.0000 mg | ORAL_TABLET | ORAL | Status: AC
  Administered 2024-03-27: 1000 mg via ORAL

## 2024-03-26 MED ORDER — CHLORHEXIDINE GLUCONATE 0.12 % MT SOLN
15.0000 mL | Freq: Once | OROMUCOSAL | Status: AC
  Administered 2024-03-27: 15 mL via OROMUCOSAL

## 2024-03-26 MED ORDER — LACTATED RINGERS IV SOLN: INTRAVENOUS | Status: DC

## 2024-03-26 MED ORDER — CELECOXIB 200 MG PO CAPS
200.0000 mg | ORAL_CAPSULE | ORAL | Status: AC
  Administered 2024-03-27: 200 mg via ORAL

## 2024-03-26 MED ORDER — GABAPENTIN 300 MG PO CAPS
300.0000 mg | ORAL_CAPSULE | ORAL | Status: AC
  Administered 2024-03-27: 300 mg via ORAL

## 2024-03-27 ENCOUNTER — Other Ambulatory Visit: Payer: Self-pay

## 2024-03-27 ENCOUNTER — Ambulatory Visit: Payer: Self-pay | Admitting: Urgent Care

## 2024-03-27 ENCOUNTER — Ambulatory Visit: Admitting: Anesthesiology

## 2024-03-27 ENCOUNTER — Encounter: Payer: Self-pay | Admitting: General Surgery

## 2024-03-27 ENCOUNTER — Encounter
Admission: RE | Disposition: A | Discharge status: Home / Self Care | Payer: Self-pay | Source: Ambulatory Visit | Attending: General Surgery

## 2024-03-27 ENCOUNTER — Ambulatory Visit
Admission: RE | Admit: 2024-03-27 | Discharge: 2024-03-27 | Disposition: A | Discharge status: Home / Self Care | Payer: Self-pay | Source: Ambulatory Visit | Attending: General Surgery | Admitting: General Surgery

## 2024-03-27 DIAGNOSIS — Z79899 Other long term (current) drug therapy: Secondary | ICD-10-CM | POA: Diagnosis not present

## 2024-03-27 DIAGNOSIS — Z923 Personal history of irradiation: Secondary | ICD-10-CM | POA: Insufficient documentation

## 2024-03-27 DIAGNOSIS — C61 Malignant neoplasm of prostate: Secondary | ICD-10-CM | POA: Insufficient documentation

## 2024-03-27 DIAGNOSIS — N401 Enlarged prostate with lower urinary tract symptoms: Secondary | ICD-10-CM | POA: Insufficient documentation

## 2024-03-27 DIAGNOSIS — K4020 Bilateral inguinal hernia, without obstruction or gangrene, not specified as recurrent: Secondary | ICD-10-CM | POA: Diagnosis present

## 2024-03-27 DIAGNOSIS — I10 Essential (primary) hypertension: Secondary | ICD-10-CM | POA: Diagnosis not present

## 2024-03-27 DIAGNOSIS — D176 Benign lipomatous neoplasm of spermatic cord: Secondary | ICD-10-CM | POA: Diagnosis not present

## 2024-03-27 DIAGNOSIS — R519 Headache, unspecified: Secondary | ICD-10-CM | POA: Diagnosis not present

## 2024-03-27 DIAGNOSIS — M1990 Unspecified osteoarthritis, unspecified site: Secondary | ICD-10-CM | POA: Insufficient documentation

## 2024-03-27 HISTORY — PX: INSERTION OF MESH: SHX5868

## 2024-03-27 HISTORY — PX: REPAIR, HERNIA, INGUINAL, BILATERAL, ROBOT-ASSISTED: SHX7636

## 2024-03-27 SURGERY — REPAIR, HERNIA, INGUINAL, BILATERAL, ROBOT-ASSISTED
Anesthesia: General | Site: Inguinal | Laterality: Bilateral

## 2024-03-27 MED ORDER — CHLORHEXIDINE GLUCONATE 0.12 % MT SOLN
OROMUCOSAL | Status: AC
  Filled 2024-03-27: qty 15

## 2024-03-27 MED ORDER — FENTANYL CITRATE (PF) 100 MCG/2ML IJ SOLN
INTRAMUSCULAR | Status: AC
  Filled 2024-03-27: qty 2

## 2024-03-27 MED ORDER — ROCURONIUM BROMIDE 100 MG/10ML IV SOLN
INTRAVENOUS | Status: DC | PRN
  Administered 2024-03-27: 20 mg via INTRAVENOUS
  Administered 2024-03-27: 50 mg via INTRAVENOUS

## 2024-03-27 MED ORDER — OXYCODONE HCL 5 MG/5ML PO SOLN: 5.0000 mg | Freq: Once | ORAL | Status: AC | PRN

## 2024-03-27 MED ORDER — BUPIVACAINE-EPINEPHRINE (PF) 0.25% -1:200000 IJ SOLN
INTRAMUSCULAR | Status: AC
Start: 2024-03-27 — End: 2024-03-27
  Filled 2024-03-27: qty 30

## 2024-03-27 MED ORDER — BUPIVACAINE-EPINEPHRINE 0.25% -1:200000 IJ SOLN
INTRAMUSCULAR | Status: DC | PRN
  Administered 2024-03-27: 30 mL

## 2024-03-27 MED ORDER — CELECOXIB 200 MG PO CAPS
ORAL_CAPSULE | ORAL | Status: AC
  Filled 2024-03-27: qty 1

## 2024-03-27 MED ORDER — FENTANYL CITRATE (PF) 100 MCG/2ML IJ SOLN
25.0000 ug | INTRAMUSCULAR | Status: DC | PRN
  Administered 2024-03-27: 25 ug via INTRAVENOUS

## 2024-03-27 MED ORDER — LIDOCAINE HCL (CARDIAC) PF 100 MG/5ML IV SOSY
PREFILLED_SYRINGE | INTRAVENOUS | Status: DC | PRN
  Administered 2024-03-27: 80 mg via INTRAVENOUS

## 2024-03-27 MED ORDER — OXYCODONE HCL 5 MG PO TABS
ORAL_TABLET | ORAL | Status: AC
  Filled 2024-03-27: qty 1

## 2024-03-27 MED ORDER — PROPOFOL 10 MG/ML IV BOLUS
INTRAVENOUS | Status: DC | PRN
  Administered 2024-03-27: 200 mg via INTRAVENOUS

## 2024-03-27 MED ORDER — ONDANSETRON HCL 4 MG/2ML IJ SOLN
INTRAMUSCULAR | Status: DC | PRN
  Administered 2024-03-27: 4 mg via INTRAVENOUS

## 2024-03-27 MED ORDER — MIDAZOLAM HCL 2 MG/2ML IJ SOLN
INTRAMUSCULAR | Status: AC
  Filled 2024-03-27: qty 2

## 2024-03-27 MED ORDER — PHENYLEPHRINE HCL-NACL 20-0.9 MG/250ML-% IV SOLN
INTRAVENOUS | Status: DC | PRN
  Administered 2024-03-27: 40 ug/min via INTRAVENOUS

## 2024-03-27 MED ORDER — CEFAZOLIN SODIUM-DEXTROSE 2-4 GM/100ML-% IV SOLN
INTRAVENOUS | Status: AC
  Filled 2024-03-27: qty 100

## 2024-03-27 MED ORDER — PHENYLEPHRINE 80 MCG/ML (10ML) SYRINGE FOR IV PUSH (FOR BLOOD PRESSURE SUPPORT)
PREFILLED_SYRINGE | INTRAVENOUS | Status: DC | PRN
  Administered 2024-03-27: 160 ug via INTRAVENOUS
  Administered 2024-03-27 (×2): 80 ug via INTRAVENOUS
  Administered 2024-03-27: 160 ug via INTRAVENOUS

## 2024-03-27 MED ORDER — ACETAMINOPHEN 500 MG PO TABS
ORAL_TABLET | ORAL | Status: AC
Start: 2024-03-27 — End: 2024-03-27
  Filled 2024-03-27: qty 2

## 2024-03-27 MED ORDER — GABAPENTIN 300 MG PO CAPS
ORAL_CAPSULE | ORAL | Status: AC
  Filled 2024-03-27: qty 1

## 2024-03-27 MED ORDER — MIDAZOLAM HCL 2 MG/2ML IJ SOLN
INTRAMUSCULAR | Status: DC | PRN
  Administered 2024-03-27: 2 mg via INTRAVENOUS

## 2024-03-27 MED ORDER — OXYCODONE HCL 5 MG PO TABS
5.0000 mg | ORAL_TABLET | Freq: Once | ORAL | Status: AC | PRN
  Administered 2024-03-27: 5 mg via ORAL

## 2024-03-27 MED ORDER — SUGAMMADEX SODIUM 200 MG/2ML IV SOLN
INTRAVENOUS | Status: DC | PRN
  Administered 2024-03-27: 200 mg via INTRAVENOUS

## 2024-03-27 MED ORDER — DEXAMETHASONE SODIUM PHOSPHATE 10 MG/ML IJ SOLN
INTRAMUSCULAR | Status: DC | PRN
  Administered 2024-03-27: 10 mg via INTRAVENOUS

## 2024-03-27 MED ORDER — HYDROCODONE-ACETAMINOPHEN 5-325 MG PO TABS: 1.0000 | ORAL_TABLET | Freq: Four times a day (QID) | ORAL | 0 refills | Status: AC | PRN

## 2024-03-27 MED ORDER — FENTANYL CITRATE (PF) 100 MCG/2ML IJ SOLN
INTRAMUSCULAR | Status: DC | PRN
  Administered 2024-03-27: 100 ug via INTRAVENOUS

## 2024-03-27 SURGICAL SUPPLY — 43 items
BAG PRESSURE INF REUSE 1000 (BAG) IMPLANT
COVER TIP SHEARS 8 DVNC (MISCELLANEOUS) ×2 IMPLANT
COVER WAND RF STERILE (DRAPES) ×2 IMPLANT
DEFOGGER SCOPE WARM SEASHARP (MISCELLANEOUS) ×2 IMPLANT
DERMABOND ADVANCED .7 DNX12 (GAUZE/BANDAGES/DRESSINGS) ×2 IMPLANT
DRAPE ARM DVNC X/XI (DISPOSABLE) ×6 IMPLANT
DRAPE COLUMN DVNC XI (DISPOSABLE) ×2 IMPLANT
ELECTRODE REM PT RTRN 9FT ADLT (ELECTROSURGICAL) ×2 IMPLANT
FORCEPS BPLR FENES DVNC XI (FORCEP) ×2 IMPLANT
GLOVE BIO SURGEON STRL SZ 6.5 (GLOVE) ×4 IMPLANT
GLOVE BIOGEL PI IND STRL 6.5 (GLOVE) ×4 IMPLANT
GLOVE SURG SYN 6.5 PF PI (GLOVE) ×4 IMPLANT
GOWN STRL REUS W/ TWL LRG LVL3 (GOWN DISPOSABLE) ×8 IMPLANT
IRRIGATOR SUCT 8 DISP DVNC XI (IRRIGATION / IRRIGATOR) IMPLANT
IV CATH ANGIO 12GX3 LT BLUE (NEEDLE) IMPLANT
IV NS 1000ML BAXH (IV SOLUTION) IMPLANT
KIT PINK PAD W/HEAD ARM REST (MISCELLANEOUS) ×2 IMPLANT
LABEL OR SOLS (LABEL) IMPLANT
MANIFOLD NEPTUNE II (INSTRUMENTS) ×2 IMPLANT
MESH 3DMAX MID 5X7 LT XLRG (Mesh General) IMPLANT
MESH 3DMAX MID 5X7 RT XLRG (Mesh General) IMPLANT
NDL DRIVE SUT CUT DVNC (INSTRUMENTS) ×2 IMPLANT
NDL HYPO 22X1.5 SAFETY MO (MISCELLANEOUS) ×2 IMPLANT
NDL INSUFFLATION 14GA 120MM (NEEDLE) ×2 IMPLANT
NEEDLE DRIVE SUT CUT DVNC (INSTRUMENTS) ×2 IMPLANT
NEEDLE HYPO 22X1.5 SAFETY MO (MISCELLANEOUS) ×2 IMPLANT
NEEDLE INSUFFLATION 14GA 120MM (NEEDLE) ×2 IMPLANT
OBTURATOR OPTICALSTD 8 DVNC (TROCAR) ×2 IMPLANT
PACK LAP CHOLECYSTECTOMY (MISCELLANEOUS) ×2 IMPLANT
SCISSORS LAP 5X35 DISP (ENDOMECHANICALS) IMPLANT
SCISSORS MNPLR CVD DVNC XI (INSTRUMENTS) ×2 IMPLANT
SEAL UNIV 5-12 XI (MISCELLANEOUS) ×6 IMPLANT
SET TUBE SMOKE EVAC HIGH FLOW (TUBING) ×2 IMPLANT
SLEEVE Z-THREAD 5X100MM (TROCAR) IMPLANT
SOLUTION ELECTROSURG ANTI STCK (MISCELLANEOUS) ×2 IMPLANT
SUT STRATA 2-0 23CM CT-2 (SUTURE) ×2 IMPLANT
SUT VIC AB 2-0 SH 27XBRD (SUTURE) ×2 IMPLANT
SUTURE MNCRL 4-0 27XMF (SUTURE) ×2 IMPLANT
TAPE TRANSPORE STRL 2 31045 (GAUZE/BANDAGES/DRESSINGS) IMPLANT
TRAP FLUID SMOKE EVACUATOR (MISCELLANEOUS) ×2 IMPLANT
TRAY FOLEY MTR SLVR 16FR STAT (SET/KITS/TRAYS/PACK) ×2 IMPLANT
TROCAR Z-THREAD FIOS 5X100MM (TROCAR) IMPLANT
WATER STERILE IRR 500ML POUR (IV SOLUTION) ×2 IMPLANT

## 2024-03-27 NOTE — Op Note (Signed)
 Preoperative diagnosis: Bilateral inguinal hernia.   Postoperative diagnosis: Bilateral inguinal hernia.  Procedure: Robotic assisted Laparoscopic Transabdominal preperitoneal laparoscopic (TAPP) repair of bilateral inguinal hernia. Laparoscopic lysis of adhesions  Anesthesia: GETA  Surgeon: Dr. Cesar Coe  Wound Classification: Clean  Indications:  Patient is a 68 y.o. male developed a symptomatic bilateral inguinal hernia. Repair was indicated.  Findings: 1. Bilateral Inguinal hernia identified 2. Vas deferens and cord structures identified and preserved 3. Bard Extra Large 3D Max MID Anatomical mesh used for repair 4. Small bowel adhered to previus umbilical hernia repair mesh. No injuries identified.   Description of procedure:  The patient was taken to the operating room and the correct side of surgery was verified. The patient was placed supine with right arm tucked at the side. After obtaining adequate anesthesia, the patient's abdomen was prepped and draped in standard sterile fashion. A time-out was completed verifying correct patient, procedure, site, positioning, and implant(s) and/or special equipment prior to beginning this procedure.  An incision was made in a natural skin line above the umbilicus. The fascia was elevated and the Veress needle inserted. Proper position was confirmed by aspiration and saline meniscus test.  The abdomen was insufflated with carbon dioxide to a pressure of 15 mmHg. The patient tolerated insufflation well.  Abdominal cavity was entered using Optiview technique with a millimeter trocar.  No injury was identified.  Another 2 mm trocars were placed lateral to each rectus muscle.  Scissors and bipolar forceps were inserted under direct visualization.  Loop of small bowel were identified adhered to the previous umbilical hernia repair mesh.  A very difficult time-consuming lysis of adhesion was needed to be done.  I inserted two 5 mm trocars in the  epigastric and left upper quadrant to use a laparoscopic scissors and grasper for the lysis of adhesions.  Once the lysis of adhesion was done I was able to docked the robot. At the robotic console, both hernias were fixed using the following technique: Transverse peritoneal incision is made about 8 cm superior to the inguinal defect. Medial to the epigastric vessels, the parietal compartment is dissected to visualize the rectus muscle. This is carried down to the symphysis pubis and the retropubic space is dissected to expose at least 2 cm contralateral to the midline. Cooper's ligament is exposed and cleared at least 2 cm below the ligament to ensure adequate space for the inferior border of the mesh. Hesselbach's triangle is cleared assessing for a direct hernia. Lateral to the epigastric vessels, the dissection is carried out in visceral compartment continuing in the true preperitoneal plane. Indirect hernia sac, was carefully reduced and separated from the cord structures with medial retraction and a combination of blunt/sharp dissection and focused cautery. This dissection was continued until the cord structures are "parietalized" completely, allowing for visualization of the reflected peritoneum that is continuous with the line originating 2 cm below Coopers medially and across the psoas muscle in the lateral compartment.  The internal ring was interrogated for a cord lipoma. The cord lipoma was reduced to the retroperitoneum and seated dorsal to the preperitoneal mesh. Having achieved a complete dissection with a critical view of the entire myopectineal orifice, a Right and Left XL mesh was then positioned centered at the iliopubic tract with the medial side crossing the midline and the inferior edge positioned 2 cm below Coopers ligament. The lateral aspect of the mesh extended 3-5 cm beyond the lateral edge of the psoas. The mesh is fixated using  an interrupted suture placed to the ipsilateral Coopers  ligament. A second suture was done at the medial superior aspect of the mesh fixating this to the rectus complex.  The peritoneal flap is closed with running barbed suture. Additional holes in the peritoneum closed with suture. Preperitoneal space gas aspirated to visualize the peritoneum apposed directly against the mesh and ensure no folding, lifting, or buckling of the mesh. Skin is closed, sterile dressings are applied.  The patient tolerated the procedure well and was taken to the postanesthesia care unit in stable condition  Specimen: None  Complications: None  Estimated Blood Loss: 10 mL

## 2024-03-27 NOTE — Anesthesia Preprocedure Evaluation (Signed)
 Anesthesia Evaluation  Patient identified by MRN, date of birth, ID band Patient awake    Reviewed: Allergy & Precautions, NPO status , Patient's Chart, lab work & pertinent test results  History of Anesthesia Complications Negative for: history of anesthetic complications  Airway Mallampati: II  TM Distance: >3 FB Neck ROM: full    Dental no notable dental hx.    Pulmonary neg pulmonary ROS   Pulmonary exam normal        Cardiovascular hypertension, On Medications negative cardio ROS Normal cardiovascular exam     Neuro/Psych  Headaches  negative psych ROS   GI/Hepatic negative GI ROS, Neg liver ROS,,,  Endo/Other  negative endocrine ROS    Renal/GU      Musculoskeletal  (+) Arthritis ,    Abdominal   Peds  Hematology negative hematology ROS (+)   Anesthesia Other Findings Past Medical History: No date: Arthritis No date: Cancer (HCC) No date: Elevated PSA No date: Headache No date: Heart murmur     Comment:  as a child No date: Hernia, inguinal, bilateral No date: Hyperlipidemia No date: Hypertension  Past Surgical History: No date: COLONOSCOPY 03/19/2023: GOLD SEED IMPLANT; N/A     Comment:  Procedure: GOLD SEED IMPLANT;  Surgeon: Devere Lonni Righter, MD;  Location: [facility];  Service: Urology;  Laterality: N/A; No date: HERNIA REPAIR No date: PROSTATE BIOPSY 03/19/2023: SPACE OAR INSTILLATION; N/A     Comment:  Procedure: SPACE OAR INSTILLATION;  Surgeon: Devere Lonni Righter, MD;  Location: [facility];  Service: Urology;  Laterality: N/A;  BMI    Body Mass Index: 27.48 kg/m      Reproductive/Obstetrics negative OB ROS                              Anesthesia Physical Anesthesia Plan  ASA: 2  Anesthesia Plan: General ETT   Post-op Pain Management: Ofirmev  IV  (intra-op)*, Toradol IV (intra-op)* and Dilaudid IV   Induction: Intravenous  PONV Risk Score and Plan: 2 and Ondansetron, Dexamethasone, Midazolam and Treatment may vary due to age or medical condition  Airway Management Planned: Oral ETT  Additional Equipment:   Intra-op Plan:   Post-operative Plan: Extubation in OR  Informed Consent: I have reviewed the patients History and Physical, chart, labs and discussed the procedure including the risks, benefits and alternatives for the proposed anesthesia with the patient or authorized representative who has indicated his/her understanding and acceptance.     Dental Advisory Given  Plan Discussed with: Anesthesiologist, CRNA and Surgeon  Anesthesia Plan Comments: (Patient consented for risks of anesthesia including but not limited to:  - adverse reactions to medications - damage to eyes, teeth, lips or other oral mucosa - nerve damage due to positioning  - sore throat or hoarseness - Damage to heart, brain, nerves, lungs, other parts of body or loss of life  Patient voiced understanding and assent.)        Anesthesia Quick Evaluation

## 2024-03-27 NOTE — Anesthesia Procedure Notes (Signed)
 Procedure Name: Intubation Date/Time: 03/27/2024 9:07 AM  Performed by: Vannie Smaller, CRNAPre-anesthesia Checklist: Patient identified, Patient being monitored, Timeout performed, Emergency Drugs available and Suction available Patient Re-evaluated:Patient Re-evaluated prior to induction Oxygen Delivery Method: Circle system utilized Preoxygenation: Pre-oxygenation with 100% oxygen Induction Type: IV induction Ventilation: Mask ventilation without difficulty Laryngoscope Size: Mac and 3 Grade View: Grade I Tube type: Oral Tube size: 7.5 mm Number of attempts: 1 Airway Equipment and Method: Stylet Placement Confirmation: ETT inserted through vocal cords under direct vision, positive ETCO2 and breath sounds checked- equal and bilateral Secured at: 23 cm Tube secured with: Tape Dental Injury: Teeth and Oropharynx as per pre-operative assessment

## 2024-03-27 NOTE — Transfer of Care (Signed)
 Immediate Anesthesia Transfer of Care Note  Patient: Glenn Burns  Procedure(s) Performed: REPAIR, HERNIA, INGUINAL, BILATERAL, ROBOT-ASSISTED (Bilateral: Inguinal) INSERTION OF MESH  Patient Location: PACU  Anesthesia Type:General  Level of Consciousness: drowsy  Airway & Oxygen Therapy: Patient Spontanous Breathing and Patient connected to face mask oxygen  Post-op Assessment: Report given to RN, Post -op Vital signs reviewed and stable, and Patient moving all extremities X 4  Post vital signs: Reviewed and stable  Last Vitals:  Vitals Value Taken Time  BP 140/95 03/27/24 11:45  Temp 36.2 C 03/27/24 11:45  Pulse 75 03/27/24 11:48  Resp 22 03/27/24 11:48  SpO2 96 % 03/27/24 11:48  Vitals shown include unfiled device data.  Last Pain:  Vitals:   03/27/24 1145  TempSrc:   PainSc: Asleep         Complications: No notable events documented.

## 2024-03-27 NOTE — Interval H&P Note (Signed)
 History and Physical Interval Note:  03/27/2024 8:24 AM  Glenn Burns  has presented today for surgery, with the diagnosis of K40.20 bilateral inguinal hernia w/o obstruction.  The various methods of treatment have been discussed with the patient and family. After consideration of risks, benefits and other options for treatment, the patient has consented to  Procedure(s): REPAIR, HERNIA, INGUINAL, BILATERAL, ROBOT-ASSISTED (Bilateral) as a surgical intervention.  The patient's history has been reviewed, patient examined, no change in status, stable for surgery.  I have reviewed the patient's chart and labs.  Questions were answered to the patient's satisfaction.     Lucas Sjogren

## 2024-03-27 NOTE — Anesthesia Postprocedure Evaluation (Signed)
 Anesthesia Post Note  Patient: Glenn Burns  Procedure(s) Performed: REPAIR, HERNIA, INGUINAL, BILATERAL, ROBOT-ASSISTED (Bilateral: Inguinal) INSERTION OF MESH  Patient location during evaluation: PACU Anesthesia Type: General Level of consciousness: awake and alert Pain management: pain level controlled Vital Signs Assessment: post-procedure vital signs reviewed and stable Respiratory status: spontaneous breathing, nonlabored ventilation, respiratory function stable and patient connected to nasal cannula oxygen Cardiovascular status: blood pressure returned to baseline and stable Postop Assessment: no apparent nausea or vomiting Anesthetic complications: no   No notable events documented.   Last Vitals:  Vitals:   03/27/24 1215 03/27/24 1230  BP: (!) 153/99 (!) 165/101  Pulse: 80 75  Resp: 18 10  Temp:  (!) 36.1 C  SpO2: 96% 93%    Last Pain:  Vitals:   03/27/24 1230  TempSrc:   PainSc: 6                  [name]

## 2024-03-27 NOTE — Discharge Instructions (Signed)

## 2024-03-30 ENCOUNTER — Encounter: Payer: Self-pay | Admitting: General Surgery

## 2024-03-31 ENCOUNTER — Encounter: Payer: Self-pay | Admitting: General Surgery
